# Patient Record
Sex: Female | Born: 1945 | Race: White | Hispanic: No | Marital: Married | Smoking: Never smoker
Health system: Southern US, Community
[De-identification: ages and names within clinical notes are randomized; demographics above are authoritative.]

## PROBLEM LIST (undated history)

## (undated) DIAGNOSIS — M1990 Unspecified osteoarthritis, unspecified site: Secondary | ICD-10-CM

## (undated) DIAGNOSIS — Z789 Other specified health status: Secondary | ICD-10-CM

## (undated) DIAGNOSIS — Z8489 Family history of other specified conditions: Secondary | ICD-10-CM

## (undated) HISTORY — PX: CATARACT EXTRACTION: SUR2

## (undated) HISTORY — PX: WISDOM TOOTH EXTRACTION: SHX21

---

## 1985-02-01 HISTORY — PX: ABDOMINAL HYSTERECTOMY: SHX81

## 1998-04-05 ENCOUNTER — Other Ambulatory Visit: Admission: RE | Admit: 1998-04-05 | Discharge: 1998-04-05 | Payer: Self-pay | Admitting: Obstetrics and Gynecology

## 1999-11-04 HISTORY — PX: CARPAL TUNNEL RELEASE: SHX101

## 2000-09-10 ENCOUNTER — Ambulatory Visit (HOSPITAL_BASED_OUTPATIENT_CLINIC_OR_DEPARTMENT_OTHER): Admission: RE | Admit: 2000-09-10 | Discharge: 2000-09-10 | Payer: Self-pay | Admitting: Orthopedic Surgery

## 2001-06-09 ENCOUNTER — Other Ambulatory Visit: Admission: RE | Admit: 2001-06-09 | Discharge: 2001-06-09 | Payer: Self-pay | Admitting: Obstetrics and Gynecology

## 2001-07-13 ENCOUNTER — Encounter: Admission: RE | Admit: 2001-07-13 | Discharge: 2001-07-13 | Payer: Self-pay | Admitting: Otolaryngology

## 2001-07-13 ENCOUNTER — Encounter: Payer: Self-pay | Admitting: Otolaryngology

## 2001-10-22 ENCOUNTER — Encounter: Payer: Self-pay | Admitting: Obstetrics and Gynecology

## 2001-10-22 ENCOUNTER — Encounter: Admission: RE | Admit: 2001-10-22 | Discharge: 2001-10-22 | Payer: Self-pay | Admitting: Obstetrics and Gynecology

## 2002-09-14 ENCOUNTER — Encounter: Admission: RE | Admit: 2002-09-14 | Discharge: 2002-09-14 | Payer: Self-pay | Admitting: Family Medicine

## 2002-09-14 ENCOUNTER — Encounter: Payer: Self-pay | Admitting: Family Medicine

## 2002-11-03 HISTORY — PX: CHOLECYSTECTOMY: SHX55

## 2003-05-11 ENCOUNTER — Encounter: Payer: Self-pay | Admitting: Emergency Medicine

## 2003-05-11 ENCOUNTER — Observation Stay (HOSPITAL_COMMUNITY): Admission: EM | Admit: 2003-05-11 | Discharge: 2003-05-12 | Payer: Self-pay | Admitting: Emergency Medicine

## 2003-05-11 ENCOUNTER — Encounter: Payer: Self-pay | Admitting: General Surgery

## 2003-05-11 ENCOUNTER — Encounter (INDEPENDENT_AMBULATORY_CARE_PROVIDER_SITE_OTHER): Payer: Self-pay | Admitting: Specialist

## 2004-01-25 ENCOUNTER — Encounter: Admission: RE | Admit: 2004-01-25 | Discharge: 2004-01-25 | Payer: Self-pay | Admitting: Obstetrics and Gynecology

## 2008-06-06 ENCOUNTER — Encounter: Admission: RE | Admit: 2008-06-06 | Discharge: 2008-06-06 | Payer: Self-pay | Admitting: Obstetrics and Gynecology

## 2010-01-28 ENCOUNTER — Encounter: Admission: RE | Admit: 2010-01-28 | Discharge: 2010-01-28 | Payer: Self-pay | Admitting: Obstetrics & Gynecology

## 2010-11-24 ENCOUNTER — Encounter: Payer: Self-pay | Admitting: Obstetrics and Gynecology

## 2011-03-21 NOTE — Op Note (Signed)
Michelle Price, Michelle Price                  ACCOUNT NO.:  [id number]   MEDICAL RECORD NO.:  [id number]                   PATIENT TYPE:  INP   LOCATION:  0105                                 FACILITY:  [facility]   PHYSICIAN:  Adolph Pollack, M.D.            DATE OF BIRTH:  [birth year]   DATE OF PROCEDURE:  05/11/2003  DATE OF DISCHARGE:                                 OPERATIVE REPORT   PREOPERATIVE DIAGNOSIS:  Acute cholecystitis.   POSTOPERATIVE DIAGNOSIS:  Acute cholecystitis.   PROCEDURE:  Laparoscopic cholecystectomy with intraoperative cholangiogram.   SURGEON:  Adolph Pollack, M.D.   ASSISTANT:  Currie Paris, M.D.   ANESTHESIA:  General.   INDICATIONS:  Michelle Price is a 65 year old female who after lunch  yesterday began having some epigastric pain radiating through the back and  nausea and vomiting.  The pain progressed and persisted all night long,  leading her to come to the emergency department, where evaluation  demonstrated gallstones, an elevation of white count, and a right upper  quadrant and epigastric tenderness and guarding.  She is now brought to the  operating room for a cholecystectomy.  The procedure and the risks were  explained to her preoperatively.   TECHNIQUE:  She was brought to the operating room and placed supine on the  operating room table, and a general anesthetic was administered.  He abdomen  was sterilely prepped and draped.  She had a previous lower midline  incision.  A small supraumbilical incision was made after injecting 0.5%  plain Marcaine, incising the skin and subcutaneous tissue until the midline  fascia was identified and a small incision made in the midline fascia.  The  peritoneal cavity was then entered bluntly and under direct vision.  A  pursestring suture of 0 Vicryl was placed around the fascial edges.  A  Hasson trocar was introduced into the peritoneal cavity and pneumoperitoneum  was created by  insufflation of CO2 gas.   Next a laparoscope was introduced and we could see acute inflammation of the  fundus and body of the gallbladder.  She was then placed in the reverse  Trendelenburg position with the right side tilted up.  An 11 mm trocar was  placed through an epigastric incision and two 5 mm trocars placed through  right midabdominal incisions.  The gallbladder was tense and bile was  aspirated from it allowing the fundus to be grasped and retracted toward the  right shoulder.  There were multiple omental adhesions to the body and  infundibulum, which were taken down bluntly.  The infundibulum was then  grasped and mobilized using blunt dissection with the cautery.  I then  isolated the cystic duct and created a window around it.  I also created a  window around the cystic artery.  I placed a clip just above the cystic duct-  gallbladder junction and a small incision was made in the cystic duct.  A  cholangiocatheter was passed through the anterior abdominal wall into the  cystic duct, and the cholangiogram was performed.   Under real-time fluoroscopy dilute contrast was injected into the cystic  duct, which was of moderate length.  The common hepatic, right and left  hepatic, and common bile ducts all filled promptly, and the common bile duct  promptly drained contrast into the duodenum without obvious evidence of  obstruction.  The final report is pending the radiologist's interpretation.   The cholangiocatheter was removed, the cystic duct was clipped three times  on the staying-in side and divided.  The cystic artery was then clipped and  divided.  The gallbladder was then dissected free from the liver bed.  There  was a small vein going directly into the gallbladder, which was clipped and  divided.  Once the gallbladder was dissected free from the liver bed, it was  placed in an Endopouch bag.  I then irrigated out the gallbladder fossa and  bleeding points were  controlled with cautery.  There was some raw surface of  the liver, and I applied Surgicel to this.  I then irrigated the area once  again and noticed no further bleeding.  I noticed no bile leakage.   I evacuated as much of the fluid as possible.  I the copiously irrigated the  perihepatic area once again and evacuated the fluid so it was relatively  clear.   I then grasped the Endopouch bag and was pulling it out through the  supraumbilical port when the bag broke and some stones spilled just directly  beneath the umbilicus.  I removed the gallbladder and then one by one was  able to retrieve all of these stones that were spilled as well as the  fragments.  I then copiously irrigated out this area and evacuated out the  fluid.   Next, under direct laparoscopic vision I closed the supraumbilical fascial  defect by tightening up and tying down the pursestring suture.  The  remaining trocars were removed, and the pneumoperitoneum was released.  The  skin incisions were closed with 4-0 Monocryl subcuticular stitches.  Steri-  Strips and sterile dressings were applied.   She tolerated the procedure well without any apparent complications and was  taken to the recovery room in satisfactory condition.                                               Adolph Pollack, M.D.    Kari Baars  D:  05/11/2003  T:  05/11/2003  Job:  [id number]   cc:   Veverly Fells. Altheimer, M.D.  [address]  [place]  [place] [address]  Fax: [phone]

## 2011-03-21 NOTE — H&P (Signed)
Michelle Price, Michelle Price                  ACCOUNT NO.:  [id number]   MEDICAL RECORD NO.:  [id number]                   PATIENT TYPE:  EMS   LOCATION:  ED                                   FACILITY:  [facility]   PHYSICIAN:  Adolph Pollack, M.D.            DATE OF BIRTH:  [birth year]   DATE OF ADMISSION:  05/11/2003  DATE OF DISCHARGE:                                HISTORY & PHYSICAL   CHIEF COMPLAINT:  Epigastric pain, nausea, vomiting.   HISTORY OF PRESENT ILLNESS:  Michelle Price is a 65 year old female who  had a spicy meal for lunch, and at about 3 o'clock began having the sudden  onset of cramping pressure-type epigastric pain going through to the back.  The pain persisted last night and was associated with nausea and vomiting  and chills.  It did not let up, and she came to the emergency department  where she was evaluated.  She was found to have multiple gallstones on  ultrasound, but no gallbladder wall thickening or pericholecystic fluid.  She did have a leukocytosis.  She had been given some pain medication, but  continues to have the discomfort, and I was asked to see her.  She denies  any jaundice or previous attacks like this.   PAST MEDICAL HISTORY:  1. Thyroid nodule.  2. Acute bronchitis.   PREVIOUS OPERATIONS:  Hysterectomy.   ALLERGIES:  ERYTHROMYCIN.   MEDICATIONS:  None.   SOCIAL HISTORY:  Denies tobacco use.  Has a glass of wine occasionally.  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   FAMILY HISTORY:  Father died from emphysema.  He also had diabetes.  She has  multiple members of her family who have gallstones and gallbladder disease.   REVIEW OF SYSTEMS:  CARDIOVASCULAR:  She denies any heart disease or  hypertension.  RESPIRATORY:  She denies pneumonia, tuberculosis.  GI:  She  denies peptic ulcer disease, hepatitis, diverticulitis, hiatal hernia.  GU:  Denies any kidney stones.  ENDOCRINE:  Denies diabetes.  NEUROLOGIC:  Denies  strokes or  seizures.  HEMATOLOGIC:  Denies any bleeding disorders,  transfusions, deep vein thrombosis.   PHYSICAL EXAMINATION:  GENERAL:  A slightly uncomfortable-appearing female,  very pleasant and cooperative.  VITAL SIGNS:  Temperature is 97.8, blood pressure 151/91, pulse 72.  HEENT:  Eyes - Extraocular muscles intact.  No scleral icterus.  NECK:  Supple without palpable masses, but there is a left thyroid nodule  noted when she swallows.  There is also a right clavicular scar noted.  RESPIRATORY:  Breath sounds equal and clear.  Respirations unlabored.  CARDIOVASCULAR:  Demonstrates a regular rate and rhythm.  No murmur heard.  She has strong peripheral pulses to palpation.  ABDOMEN:  Soft.  Right upper quadrant epigastric tenderness and guarding.  There is a positive Murphy's sign present.  Hypoactive bowel sounds are  noted.  There is a lower midline scar that is also noted.  BACK:  No deformities.  EXTREMITIES:  Full range of motion, and ambulates fairly well.   LABORATORY DATA:  White cell count is 16,700 with a hemoglobin of 13.8.  Liver function tests are within normal limits.  Essentially glucose is 135.  The rest of her CMET is within normal limits.  Lipase is 19.  UA is  essentially negative.   Abdominal ultrasound was reviewed and showed multiple gallstones.   IMPRESSION:  Evolving acute cholecystitis.  Continues to have pain despite  two rounds of 4 mg of morphine and Phenergan.  Does have some local  peritoneal signs in the right upper quadrant.   PLAN:  I discussed with her two options of antibiotic therapy and delay  cholecystectomy.  We also discussed laparoscopic cholecystectomy which I  said was the preferred treatment overall.  We went over the procedure and  the risks including, but not limited to, bleeding, infection, common bile  duct injury, hepatic injury and bile leak, small intestinal injury, the risk  of general anesthesia, as well as post cholecystectomy  diarrhea or  indigestion.  She seemed to understand these and would like to proceed.                                               Adolph Pollack, M.D.    Kari Baars  D:  05/11/2003  T:  05/11/2003  Job:  [id number]   cc:   Veverly Fells. Altheimer, M.D.  [address], [address]  [place]  [place] [address]  Fax: [phone]

## 2011-03-21 NOTE — Op Note (Signed)
[place]  Patient:    Michelle Price, Michelle Price                 MRN: [id number] Proc. Date: 09/10/00 Adm. Date:  [id number] Attending:  Ronne Binning                           Operative Report  PREOPERATIVE DIAGNOSIS:  Carpal tunnel syndrome, right hand.  POSTOPERATIVE DIAGNOSIS:  Carpal tunnel syndrome, right hand.  PROCEDURE:  Decompression, right median nerve.  SURGEON:  Nicki Reaper, M.D.  ASSISTANT:  R.N.  ANESTHESIA:  Forearm-based IV regional.  ANESTHESIOLOGIST:  Cliffton Asters. Ivin Booty, M.D.  HISTORY:  The patient is a 65 year old female with a history of carpal tunnel syndrome.  EMG nerve conduction was positive, which has not responded to conservative treatment.  DESCRIPTION OF PROCEDURE:  The patient was brought to the operating room, where a forearm-based IV regional anesthetic was carried out without difficulty.  She was prepped and draped using Betadine scrub and solution with the right arm free.  A longitudinal incision was made in the palm and carried down through subcutaneous tissue.  Bleeders were electrocauterized.  Palmar fascia was split, superficial palmar arch identified, and flexor tendon in the ring and little finger identified to the ulnar side of the median nerve.  The carpal retinaculum was incised with sharp dissection.  A right angle and Sewell retractor were placed between skin and forearm fascia.  The fascia was then released for approximately 3 cm proximal to the wrist crease under direct vision.  The canal was explored, no further lesions were identified. Tenosynovial tissue was moderately thickened.  The wound was irrigated.  The skin was closed with interrupted 5-0 nylon suture.  A sterile compressive dressing and splint were applied.  The patient tolerated the procedure well and was taken to the recovery room for observation in satisfactory condition. She is discharged home, to return to the [facility]  [place] in one week, on Vicodin and Keflex. DD:  09/10/00 TD:  09/11/00 Job: [id number]

## 2011-11-04 HISTORY — PX: BREAST CYST ASPIRATION: SHX578

## 2012-06-21 ENCOUNTER — Other Ambulatory Visit: Payer: Self-pay | Admitting: Obstetrics & Gynecology

## 2012-06-21 DIAGNOSIS — Z1231 Encounter for screening mammogram for malignant neoplasm of breast: Secondary | ICD-10-CM

## 2012-06-22 ENCOUNTER — Ambulatory Visit
Admission: RE | Admit: 2012-06-22 | Discharge: 2012-06-22 | Disposition: A | Discharge status: Home / Self Care | Payer: PRIVATE HEALTH INSURANCE | Source: Ambulatory Visit | Attending: Obstetrics & Gynecology | Admitting: Obstetrics & Gynecology

## 2012-06-22 DIAGNOSIS — Z1231 Encounter for screening mammogram for malignant neoplasm of breast: Secondary | ICD-10-CM

## 2012-06-23 ENCOUNTER — Other Ambulatory Visit: Payer: Self-pay | Admitting: Obstetrics & Gynecology

## 2012-06-23 DIAGNOSIS — R928 Other abnormal and inconclusive findings on diagnostic imaging of breast: Secondary | ICD-10-CM

## 2012-06-30 ENCOUNTER — Other Ambulatory Visit (HOSPITAL_COMMUNITY)
Admission: RE | Admit: 2012-06-30 | Discharge: 2012-06-30 | Disposition: A | Discharge status: Home / Self Care | Payer: PRIVATE HEALTH INSURANCE | Source: Ambulatory Visit | Attending: Radiology | Admitting: Radiology

## 2012-06-30 ENCOUNTER — Ambulatory Visit
Admission: RE | Admit: 2012-06-30 | Discharge: 2012-06-30 | Disposition: A | Discharge status: Home / Self Care | Payer: PRIVATE HEALTH INSURANCE | Source: Ambulatory Visit | Attending: Obstetrics & Gynecology | Admitting: Obstetrics & Gynecology

## 2012-06-30 ENCOUNTER — Other Ambulatory Visit: Payer: Self-pay | Admitting: Obstetrics & Gynecology

## 2012-06-30 DIAGNOSIS — R928 Other abnormal and inconclusive findings on diagnostic imaging of breast: Secondary | ICD-10-CM

## 2012-06-30 DIAGNOSIS — N6009 Solitary cyst of unspecified breast: Secondary | ICD-10-CM | POA: Insufficient documentation

## 2013-05-09 ENCOUNTER — Other Ambulatory Visit: Payer: Self-pay | Admitting: Nurse Practitioner

## 2013-05-09 NOTE — Telephone Encounter (Signed)
Patient was given rx #30/[id number] on 11/10/12. AEX scheduled 11/15/13. Please advise if ok to refill until then. Chart on your desk.

## 2013-05-09 NOTE — Telephone Encounter (Signed)
OK to refill for 6 more months

## 2013-05-10 NOTE — Telephone Encounter (Signed)
RX signed and faxed.

## 2013-05-10 NOTE — Telephone Encounter (Signed)
RX printed for signature. 

## 2013-07-27 ENCOUNTER — Telehealth: Payer: Self-pay | Admitting: Nurse Practitioner

## 2013-07-27 NOTE — Telephone Encounter (Signed)
Patient is switching insurances and vivele dot medication she is taking is going to cost her a lot more than it was and she was wondering if there was another medication that was comparable to it that could replace the vivele dot. (new insurance is medicare complete)

## 2013-07-28 NOTE — Telephone Encounter (Signed)
Patty do you have any advise regarding change from vivele dot to anything that may cost less?

## 2013-07-29 NOTE — Telephone Encounter (Signed)
After review of her chart - the dose of Vivelle is 0.025 - there is no Minivelle dose available in this strength. The other choice is oral estrogen and the lowest dose in 0.5 - so would have to cut in half.  Probably the best choice is to get a refill on Vivelle patches for a few months and wean off ERT.  She has reached her 10 year maximum benefit for use of ERT.  But this may be a personal preference to stay on.

## 2013-07-29 NOTE — Telephone Encounter (Signed)
Spoke with patient. She is agreeable to wean off the patches. She states that her mother was on hormones until age 66 and she feels she may need to continue. She states she will taper and then call if anything changes or will discuss at her aex in January.

## 2013-07-29 NOTE — Telephone Encounter (Signed)
Spoke with Patty:  To wean: Use one patch q 5 days for a two weeks, then use one patch q 7 days for two weeks then one patch q 14 days until done.   Message left to return call to nurse at [phone]

## 2013-08-30 ENCOUNTER — Telehealth: Payer: Self-pay

## 2013-08-30 ENCOUNTER — Other Ambulatory Visit: Payer: Self-pay | Admitting: Orthopedic Surgery

## 2013-08-30 NOTE — Telephone Encounter (Signed)
Recall has been entered for 10-03-13.

## 2013-08-30 NOTE — Telephone Encounter (Signed)
I agree with changing recall.  She is clearly aware and documentation should be adequate.  Thanks.

## 2013-08-30 NOTE — Telephone Encounter (Signed)
Message copied by Elisha Headland on Tue Aug 30, 2013 11:07 AM ------      Message from: Alisa Graff      Created: Tue Aug 23, 2013 11:28 AM      Regarding: MMG recall       Please contact this patient to see if she has had MMG this year.  Was due in Feb and no results in computer. ------

## 2013-08-30 NOTE — Telephone Encounter (Signed)
Since patient clearly aware, should we send letter and remove?

## 2013-08-30 NOTE — Telephone Encounter (Signed)
lmtcb

## 2013-08-30 NOTE — Telephone Encounter (Signed)
Spoke with patient, states she has not had the MMG yet, but has not forgotten it. She has had a busy summer and is having hand surgery next week. I offered to schedule appointment for her, but she declined. She said she would have it in November, not sure if she will be able to drive and will have to coordinate with a driver. I put back in recall for 10/03/13. Please advise if this is not correct or I need to do anything additional.

## 2013-09-02 ENCOUNTER — Encounter (HOSPITAL_BASED_OUTPATIENT_CLINIC_OR_DEPARTMENT_OTHER): Payer: Self-pay | Admitting: *Deleted

## 2013-09-02 NOTE — Progress Notes (Signed)
No labs needed

## 2013-09-07 ENCOUNTER — Encounter (HOSPITAL_BASED_OUTPATIENT_CLINIC_OR_DEPARTMENT_OTHER): Payer: Medicare Other | Admitting: Anesthesiology

## 2013-09-07 ENCOUNTER — Encounter (HOSPITAL_BASED_OUTPATIENT_CLINIC_OR_DEPARTMENT_OTHER): Payer: Self-pay | Admitting: Orthopedic Surgery

## 2013-09-07 ENCOUNTER — Ambulatory Visit (HOSPITAL_BASED_OUTPATIENT_CLINIC_OR_DEPARTMENT_OTHER)
Admission: RE | Admit: 2013-09-07 | Discharge: 2013-09-07 | Disposition: A | Discharge status: Home / Self Care | Payer: Medicare Other | Source: Ambulatory Visit | Attending: Orthopedic Surgery | Admitting: Orthopedic Surgery

## 2013-09-07 ENCOUNTER — Ambulatory Visit (HOSPITAL_BASED_OUTPATIENT_CLINIC_OR_DEPARTMENT_OTHER): Payer: Medicare Other | Admitting: Anesthesiology

## 2013-09-07 ENCOUNTER — Encounter (HOSPITAL_BASED_OUTPATIENT_CLINIC_OR_DEPARTMENT_OTHER)
Admission: RE | Disposition: A | Discharge status: Home / Self Care | Payer: Self-pay | Source: Ambulatory Visit | Attending: Orthopedic Surgery

## 2013-09-07 DIAGNOSIS — Z9089 Acquired absence of other organs: Secondary | ICD-10-CM | POA: Insufficient documentation

## 2013-09-07 DIAGNOSIS — Z885 Allergy status to narcotic agent status: Secondary | ICD-10-CM | POA: Insufficient documentation

## 2013-09-07 DIAGNOSIS — M19049 Primary osteoarthritis, unspecified hand: Secondary | ICD-10-CM | POA: Insufficient documentation

## 2013-09-07 DIAGNOSIS — G5600 Carpal tunnel syndrome, unspecified upper limb: Secondary | ICD-10-CM | POA: Insufficient documentation

## 2013-09-07 DIAGNOSIS — Z90710 Acquired absence of both cervix and uterus: Secondary | ICD-10-CM | POA: Insufficient documentation

## 2013-09-07 HISTORY — DX: Unspecified osteoarthritis, unspecified site: M19.90

## 2013-09-07 HISTORY — PX: CARPAL TUNNEL RELEASE: SHX101

## 2013-09-07 HISTORY — PX: STERIOD INJECTION: SHX5046

## 2013-09-07 HISTORY — DX: Family history of other specified conditions: Z84.89

## 2013-09-07 HISTORY — DX: Other specified health status: Z78.9

## 2013-09-07 LAB — POCT HEMOGLOBIN-HEMACUE: Hemoglobin: 12 g/dL (ref 12.0–15.0)

## 2013-09-07 SURGERY — CARPAL TUNNEL RELEASE
Anesthesia: Monitor Anesthesia Care | Site: Wrist | Laterality: Left | Wound class: Clean

## 2013-09-07 MED ORDER — BUPIVACAINE HCL (PF) 0.25 % IJ SOLN
INTRAMUSCULAR | Status: DC | PRN
  Administered 2013-09-07: 7 mL
  Administered 2013-09-07: .5 mL

## 2013-09-07 MED ORDER — CHLORHEXIDINE GLUCONATE 4 % EX LIQD: 60.0000 mL | Freq: Once | CUTANEOUS | Status: DC

## 2013-09-07 MED ORDER — HYDROMORPHONE HCL PF 1 MG/ML IJ SOLN
INTRAMUSCULAR | Status: AC
  Filled 2013-09-07: qty 1

## 2013-09-07 MED ORDER — BUPIVACAINE HCL (PF) 0.25 % IJ SOLN
INTRAMUSCULAR | Status: AC
  Filled 2013-09-07: qty 30

## 2013-09-07 MED ORDER — MIDAZOLAM HCL 2 MG/2ML IJ SOLN
INTRAMUSCULAR | Status: AC
  Filled 2013-09-07: qty 2

## 2013-09-07 MED ORDER — HYDROMORPHONE HCL PF 1 MG/ML IJ SOLN
0.2500 mg | INTRAMUSCULAR | Status: DC | PRN
  Administered 2013-09-07 (×2): 0.5 mg via INTRAVENOUS

## 2013-09-07 MED ORDER — PROPOFOL 10 MG/ML IV BOLUS
INTRAVENOUS | Status: DC | PRN
  Administered 2013-09-07: 40 mg via INTRAVENOUS

## 2013-09-07 MED ORDER — FENTANYL CITRATE 0.05 MG/ML IJ SOLN
INTRAMUSCULAR | Status: AC
  Filled 2013-09-07: qty 2

## 2013-09-07 MED ORDER — BETAMETHASONE SOD PHOS & ACET 6 (3-3) MG/ML IJ SUSP
INTRAMUSCULAR | Status: AC
  Filled 2013-09-07: qty 1

## 2013-09-07 MED ORDER — MIDAZOLAM HCL 5 MG/5ML IJ SOLN
INTRAMUSCULAR | Status: DC | PRN
  Administered 2013-09-07: 2 mg via INTRAVENOUS

## 2013-09-07 MED ORDER — CEFAZOLIN SODIUM-DEXTROSE 2-3 GM-% IV SOLR
2.0000 g | INTRAVENOUS | Status: AC
  Administered 2013-09-07: 2 g via INTRAVENOUS

## 2013-09-07 MED ORDER — ONDANSETRON HCL 4 MG/2ML IJ SOLN: 4.0000 mg | Freq: Once | INTRAMUSCULAR | Status: DC | PRN

## 2013-09-07 MED ORDER — LACTATED RINGERS IV SOLN
INTRAVENOUS | Status: DC
  Administered 2013-09-07 (×2): via INTRAVENOUS

## 2013-09-07 MED ORDER — SODIUM CHLORIDE 0.9 % IJ SOLN
INTRAMUSCULAR | Status: AC
  Filled 2013-09-07: qty 10

## 2013-09-07 MED ORDER — LIDOCAINE HCL (PF) 0.5 % IJ SOLN
INTRAMUSCULAR | Status: DC | PRN
  Administered 2013-09-07: 30 mL via INTRAVENOUS

## 2013-09-07 MED ORDER — CEFAZOLIN SODIUM-DEXTROSE 2-3 GM-% IV SOLR
INTRAVENOUS | Status: AC
  Filled 2013-09-07: qty 50

## 2013-09-07 MED ORDER — BETAMETHASONE SOD PHOS & ACET 6 (3-3) MG/ML IJ SUSP
INTRAMUSCULAR | Status: DC | PRN
  Administered 2013-09-07: 3 mg via INTRA_ARTICULAR

## 2013-09-07 MED ORDER — OXYCODONE HCL 5 MG/5ML PO SOLN
5.0000 mg | Freq: Once | ORAL | Status: DC | PRN
Start: 2013-09-07 — End: 2013-09-07

## 2013-09-07 MED ORDER — ONDANSETRON HCL 4 MG/2ML IJ SOLN
INTRAMUSCULAR | Status: DC | PRN
  Administered 2013-09-07: 4 mg via INTRAVENOUS

## 2013-09-07 MED ORDER — PROPOFOL 10 MG/ML IV EMUL
INTRAVENOUS | Status: AC
  Filled 2013-09-07: qty 50

## 2013-09-07 MED ORDER — METHYLPREDNISOLONE ACETATE 40 MG/ML IJ SUSP
INTRAMUSCULAR | Status: AC
  Filled 2013-09-07: qty 1

## 2013-09-07 MED ORDER — PROMETHAZINE HCL 25 MG/ML IJ SOLN
INTRAMUSCULAR | Status: AC
  Filled 2013-09-07: qty 1

## 2013-09-07 MED ORDER — OXYCODONE HCL 5 MG PO TABS
5.0000 mg | ORAL_TABLET | Freq: Once | ORAL | Status: DC | PRN
Start: 2013-09-07 — End: 2013-09-07

## 2013-09-07 MED ORDER — PROPOFOL INFUSION 10 MG/ML OPTIME
INTRAVENOUS | Status: DC | PRN
  Administered 2013-09-07: 100 ug/kg/min via INTRAVENOUS

## 2013-09-07 MED ORDER — PROMETHAZINE HCL 25 MG/ML IJ SOLN
6.2500 mg | INTRAMUSCULAR | Status: DC | PRN
  Administered 2013-09-07: 6.25 mg via INTRAVENOUS

## 2013-09-07 MED ORDER — FENTANYL CITRATE 0.05 MG/ML IJ SOLN
INTRAMUSCULAR | Status: DC | PRN
  Administered 2013-09-07: 100 ug via INTRAVENOUS

## 2013-09-07 SURGICAL SUPPLY — 49 items
BANDAGE ADHESIVE 1X3 (GAUZE/BANDAGES/DRESSINGS) IMPLANT
BANDAGE GAUZE ELAST BULKY 4 IN (GAUZE/BANDAGES/DRESSINGS) ×3 IMPLANT
BLADE SURG 15 STRL LF DISP TIS (BLADE) ×2 IMPLANT
BLADE SURG 15 STRL SS (BLADE) ×1
BNDG COHESIVE 3X5 TAN STRL LF (GAUZE/BANDAGES/DRESSINGS) ×3 IMPLANT
BNDG ESMARK 4X9 LF (GAUZE/BANDAGES/DRESSINGS) IMPLANT
CHLORAPREP W/TINT 26ML (MISCELLANEOUS) ×3 IMPLANT
CORDS BIPOLAR (ELECTRODE) ×3 IMPLANT
COVER MAYO STAND STRL (DRAPES) ×3 IMPLANT
COVER TABLE BACK 60X90 (DRAPES) ×3 IMPLANT
CUFF TOURNIQUET SINGLE 18IN (TOURNIQUET CUFF) ×3 IMPLANT
DECANTER SPIKE VIAL GLASS SM (MISCELLANEOUS) IMPLANT
DRAPE EXTREMITY T 121X128X90 (DRAPE) ×3 IMPLANT
DRAPE SURG 17X23 STRL (DRAPES) ×3 IMPLANT
DRSG KUZMA FLUFF (GAUZE/BANDAGES/DRESSINGS) IMPLANT
DRSG PAD ABDOMINAL 8X10 ST (GAUZE/BANDAGES/DRESSINGS) ×3 IMPLANT
GAUZE XEROFORM 1X8 LF (GAUZE/BANDAGES/DRESSINGS) ×3 IMPLANT
GLOVE BIO SURGEON STRL SZ 6.5 (GLOVE) IMPLANT
GLOVE BIOGEL M 7.0 STRL (GLOVE) ×3 IMPLANT
GLOVE BIOGEL PI IND STRL 7.0 (GLOVE) ×2 IMPLANT
GLOVE BIOGEL PI IND STRL 7.5 (GLOVE) ×2 IMPLANT
GLOVE BIOGEL PI IND STRL 8.5 (GLOVE) ×2 IMPLANT
GLOVE BIOGEL PI INDICATOR 7.0 (GLOVE) ×1
GLOVE BIOGEL PI INDICATOR 7.5 (GLOVE) ×1
GLOVE BIOGEL PI INDICATOR 8.5 (GLOVE) ×1
GLOVE ECLIPSE 6.5 STRL STRAW (GLOVE) ×3 IMPLANT
GLOVE SURG ORTHO 8.0 STRL STRW (GLOVE) ×3 IMPLANT
GOWN BRE IMP PREV XXLGXLNG (GOWN DISPOSABLE) ×3 IMPLANT
GOWN PREVENTION PLUS XLARGE (GOWN DISPOSABLE) ×6 IMPLANT
NDL SAFETY ECLIPSE 18X1.5 (NEEDLE) IMPLANT
NEEDLE 27GAX1X1/2 (NEEDLE) ×6 IMPLANT
NEEDLE HYPO 18GX1.5 SHARP (NEEDLE)
NS IRRIG 1000ML POUR BTL (IV SOLUTION) ×3 IMPLANT
PACK BASIN DAY SURGERY FS (CUSTOM PROCEDURE TRAY) ×3 IMPLANT
PAD ALCOHOL SWAB (MISCELLANEOUS) ×3 IMPLANT
PAD CAST 3X4 CTTN HI CHSV (CAST SUPPLIES) ×2 IMPLANT
PADDING CAST ABS 4INX4YD NS (CAST SUPPLIES) ×1
PADDING CAST ABS COTTON 4X4 ST (CAST SUPPLIES) ×2 IMPLANT
PADDING CAST COTTON 3X4 STRL (CAST SUPPLIES) ×1
SPONGE GAUZE 4X4 12PLY (GAUZE/BANDAGES/DRESSINGS) ×3 IMPLANT
STOCKINETTE 4X48 STRL (DRAPES) ×3 IMPLANT
SUT VICRYL 4-0 PS2 18IN ABS (SUTURE) IMPLANT
SUT VICRYL RAPIDE 4/0 PS 2 (SUTURE) ×3 IMPLANT
SWABSTICK POVIDONE IODINE SNGL (MISCELLANEOUS) IMPLANT
SYR 3ML LL SCALE MARK (SYRINGE) ×3 IMPLANT
SYR BULB 3OZ (MISCELLANEOUS) ×3 IMPLANT
SYR CONTROL 10ML LL (SYRINGE) ×3 IMPLANT
TOWEL OR 17X24 6PK STRL BLUE (TOWEL DISPOSABLE) ×3 IMPLANT
UNDERPAD 30X30 INCONTINENT (UNDERPADS AND DIAPERS) ×3 IMPLANT

## 2013-09-07 NOTE — Anesthesia Preprocedure Evaluation (Addendum)
Anesthesia Evaluation  Patient identified by MRN, date of birth, ID band Patient awake    Reviewed: Allergy & Precautions, H&P , NPO status , Patient's Chart, lab work & pertinent test results  Airway Mallampati: I TM Distance: >3 FB Neck ROM: Full    Dental  (+) Teeth Intact and Dental Advisory Given   Pulmonary  breath sounds clear to auscultation        Cardiovascular Rhythm:Regular Rate:Normal     Neuro/Psych    GI/Hepatic   Endo/Other    Renal/GU      Musculoskeletal   Abdominal   Peds  Hematology   Anesthesia Other Findings   Reproductive/Obstetrics                           Anesthesia Physical Anesthesia Plan  ASA: I  Anesthesia Plan: Bier Block and MAC   Post-op Pain Management:    Induction: Intravenous  Airway Management Planned: Simple Face Mask  Additional Equipment:   Intra-op Plan:   Post-operative Plan:   Informed Consent: I have reviewed the patients History and Physical, chart, labs and discussed the procedure including the risks, benefits and alternatives for the proposed anesthesia with the patient or authorized representative who has indicated his/her understanding and acceptance.   Dental advisory given  Plan Discussed with: CRNA, Anesthesiologist and Surgeon  Anesthesia Plan Comments:        Anesthesia Quick Evaluation

## 2013-09-07 NOTE — Op Note (Signed)
Dictation Number [id number]

## 2013-09-07 NOTE — Transfer of Care (Signed)
Immediate Anesthesia Transfer of Care Note  Patient: Michelle Price  Procedure(s) Performed: Procedure(s): LEFT CARPAL TUNNEL RELEASE (Left)  INJECTION LEFT CARPALMETACARPAL THUMB (Left)  Patient Location: PACU  Anesthesia Type:MAC and Bier block  Level of Consciousness: awake, alert  and oriented  Airway & Oxygen Therapy: Patient Spontanous Breathing and Patient connected to face mask oxygen  Post-op Assessment: Report given to PACU RN and Post -op Vital signs reviewed and stable  Post vital signs: Reviewed and stable  Complications: No apparent anesthesia complications

## 2013-09-07 NOTE — Brief Op Note (Signed)
09/07/2013  12:15 PM  PATIENT:  Michelle Price  67 y.o. female  PRE-OPERATIVE DIAGNOSIS:  LEFT CARPAL TUNNEL SYNDROME,ARTHRITIS CARPALMETACARPAL THUMB  POST-OPERATIVE DIAGNOSIS:  * No post-op diagnosis entered *  PROCEDURE:  Procedure(s): LEFT CARPAL TUNNEL RELEASE (Left)  INJECTION LEFT CARPALMETACARPAL THUMB (Left)  SURGEON:  Surgeon(s) and Role:    * Nicki Reaper, MD - Primary  PHYSICIAN ASSISTANT:   ASSISTANTS: none   ANESTHESIA:   local and regional  EBL:  Total I/O In: 700 [I.V.:700] Out: -   BLOOD ADMINISTERED:none  DRAINS: none   LOCAL MEDICATIONS USED:  MARCAINE     SPECIMEN:  No Specimen  DISPOSITION OF SPECIMEN:  N/A  COUNTS:  YES  TOURNIQUET:  * Missing tourniquet times found for documented tourniquets in log:  [id number] *  DICTATION: .Other Dictation: Dictation Number [id number]  PLAN OF CARE: Discharge to home after PACU  PATIENT DISPOSITION:  PACU - hemodynamically stable.

## 2013-09-07 NOTE — Anesthesia Postprocedure Evaluation (Signed)
  Anesthesia Post-op Note  Patient: Michelle Price  Procedure(s) Performed: Procedure(s): LEFT CARPAL TUNNEL RELEASE (Left)  INJECTION LEFT CARPALMETACARPAL THUMB (Left)  Patient Location: PACU  Anesthesia Type:General  Level of Consciousness: awake, alert  and oriented  Airway and Oxygen Therapy: Patient Spontanous Breathing and Patient connected to face mask oxygen  Post-op Pain: none  Post-op Assessment: Post-op Vital signs reviewed  Post-op Vital Signs: Reviewed  Complications: No apparent anesthesia complications

## 2013-09-07 NOTE — H&P (Signed)
Michelle Price returns today. She has not been seen in 13-years. She comes in complaining of her left hand basilar area of her thumb. This has been going on for the past 2 years. It has gotten worse over the past 3-4 months. She reports no history of injury. She is s/p right carpal tunnel release done in 2001 by me. She had positive nerve conductions at that time bilaterally. She is not complaining of a great deal of numbness and tingling. It does not awaken her at night. She complains of intermittent severe, throbbing, aching and burning type pain with a feeling of numbness and weakness. She states it is gradually getting worse. Activity and exercise make it worse, rest makes it better. She has been taking ibuprofen for this. 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 The splinting has given her some relief of symptoms. We have discussed the possibility of an injection to the carpal canal vs surgical intervention. She has had her right carpal canal released.     PAST MEDICAL HISTORY: She is allergic to Codeine and E-Mycin. She is on Vivelle. She has had a cholecystectomy, hysterectomy and carpal tunnel release.  FAMILY H ISTORY: Positive for arthritis in her mother.  SOCIAL HISTORY: She does not smoke. She drinks socially. She is married and retired.   REVIEW OF SYSTEMS: Negative for 14 points. [name] is an 67 y.o. female.   Chief Complaint: CTS left CMC arthritis left thumb HPI: see above  Past Medical History  Diagnosis Date  . Medical history non-contributory   . Arthritis   . Complication of anesthesia     mom hard to wake up    Past Surgical History  Procedure Laterality Date  . Abdominal hysterectomy    . Wisdom tooth extraction    . Carpal tunnel release  2001    right  . Cholecystectomy  2004    lap choli    History reviewed. No  pertinent family history. Social History:  reports that she has never smoked. She does not have any smokeless tobacco history on file. She reports that she drinks alcohol. She reports that she does not use illicit drugs.  Allergies:  Allergies  Allergen Reactions  . Codeine Nausea And Vomiting    Medications Prior to Admission  Medication Sig Dispense Refill  . estradiol (VIVELLE-DOT) 0.025 MG/24HR Place 1 patch onto the skin 2 (two) times a week.      . zolpidem (AMBIEN) 5 MG tablet take 1 tablet by mouth at bedtime if needed  30 tablet  5    No results found for this or any previous visit (from the past 48 hour(s)).  No results found.   Pertinent items are noted in HPI.  Height 5\' 3"  (1.6 m), weight 178 lb (80.74 kg).  General appearance: alert, cooperative and appears stated age Head: Normocephalic, without obvious abnormality Neck: no JVD Resp: clear to auscultation bilaterally Cardio: regular rate and rhythm, S1, S2 normal, no murmur, click, rub or gallop GI: soft, non-tender; bowel sounds normal; no masses,  no organomegaly Extremities: extremities normal, atraumatic, no cyanosis or edema Pulses: 2+ and symmetric Skin: Skin color, texture, turgor normal. No rashes or lesions Neurologic: Grossly normal Incision/Wound: na  Assessment/Plan  She would like to simply proceed to surgery. We would not recommend doing any surgical intervention to the [facility] joint. She is in agreement  with this. However, we would recommend injecting the left thumb CMC at the same times as the carpal tunnel release. The pre, peri and post op course are discussed along with risks and complications.  She is aware there is no guarantee with surgery, possibility of infection, recurrence, injury to arteries, nerves and tendons, incomplete relief of symptoms and dystrophy.  She is scheduled for left carpal tunnel release with injection CMC joint left thumb under image intensification.  Bernadett Milian  R 09/07/2013, 9:52 AM

## 2013-09-08 ENCOUNTER — Other Ambulatory Visit: Payer: Self-pay | Admitting: Obstetrics & Gynecology

## 2013-09-08 DIAGNOSIS — N6002 Solitary cyst of left breast: Secondary | ICD-10-CM

## 2013-09-08 NOTE — Op Note (Signed)
NAMEAMERE, IOTT NO.:  [id number]  MEDICAL RECORD NO.:  [id number]  LOCATION:                                 FACILITY:  PHYSICIAN:  Cindee Salt, M.D.            DATE OF BIRTH:  DATE OF PROCEDURE:  09/07/2013 DATE OF DISCHARGE:                              OPERATIVE REPORT   PREOPERATIVE DIAGNOSES:  Carpal tunnel syndrome, left hand.  CMC arthritis, left thumb.  POSTOPERATIVE DIAGNOSES:  Carpal tunnel syndrome, left hand.  CMC arthritis, left thumb.  OPERATION:  Release of left carpal canal with injection to carpometacarpal joint, left thumb.  SURGEON:  Cindee Salt, MD  ANESTHESIA:  Forearm-based IV regional with local infiltration.  ANESTHESIOLOGIST:  Sheldon Silvan, M.D.  HISTORY:  The patient is a 67 year old female with a history of bilateral carpal tunnel.  She has undergone release on the right side. She also has CMC arthritis to left side.  She is desirous of proceeding to have left carpal tunnel release and nerve conductions are positive. She is aware of risks and complications including infection; recurrence of injury to arteries, nerves, tendons, incomplete relief of symptoms, dystrophy.  In the preoperative area, the patient was seen, the extremity marked by both patient and surgeon.  Antibiotic given.  PROCEDURE IN DETAIL:  The patient was brought to the operating room where a forearm-based IV regional anesthetic was carried out without difficulty.  She was prepped using ChloraPrep, supine position, left arm free.  A 3-minute dry time was allowed.  Time-out taken, confirming patient and procedure.  A longitudinal incision was made in the left palm, carried down through subcutaneous tissue.  Bleeders were electrocauterized.  Palmar fascia was split.  Superficial palmar arch identified.  Flexor tendon in the ring and middle finger identified to the ulnar side of the median nerve.  The carpal retinaculum was incised with sharp dissection.   Right angle and Sewall retractor were placed between skin and forearm fascia.  Fascia released for approximately a centimeter and half proximal to the wrist crease under direct vision. Canal was explored.  Air compression of the nerve was apparent.  Motor branch entered into the muscle.  The wound was irrigated and closed with interrupted 4-0 Vicryl Rapide sutures.  The carpometacarpal joint of the thumb was then injected with Celestone and Xylocaine.  A sterile compressive dressing was applied.  On deflation of the tourniquet, all fingers immediately pinked.  She was taken to the recovery room for observation in satisfactory condition.  She will be discharged home to return to the [facility] of [place] in 1 week on [place].          ______________________________ Cindee Salt, M.D.     GK/MEDQ  D:  09/07/2013  T:  09/08/2013  Job:  [id number]

## 2013-09-09 ENCOUNTER — Encounter (HOSPITAL_BASED_OUTPATIENT_CLINIC_OR_DEPARTMENT_OTHER): Payer: Self-pay | Admitting: Orthopedic Surgery

## 2013-09-26 ENCOUNTER — Ambulatory Visit
Admission: RE | Admit: 2013-09-26 | Discharge: 2013-09-26 | Disposition: A | Discharge status: Home / Self Care | Payer: Medicare Other | Source: Ambulatory Visit | Attending: Obstetrics & Gynecology | Admitting: Obstetrics & Gynecology

## 2013-09-26 DIAGNOSIS — N6002 Solitary cyst of left breast: Secondary | ICD-10-CM

## 2013-11-15 ENCOUNTER — Telehealth: Payer: Self-pay | Admitting: Obstetrics & Gynecology

## 2013-11-15 ENCOUNTER — Encounter: Payer: Self-pay | Admitting: Nurse Practitioner

## 2013-11-15 ENCOUNTER — Ambulatory Visit (INDEPENDENT_AMBULATORY_CARE_PROVIDER_SITE_OTHER): Payer: Medicare Other | Admitting: Nurse Practitioner

## 2013-11-15 VITALS — BP 110/62 | HR 80 | Ht 63.0 in | Wt 183.0 lb

## 2013-11-15 DIAGNOSIS — Z780 Asymptomatic menopausal state: Secondary | ICD-10-CM

## 2013-11-15 DIAGNOSIS — Z01419 Encounter for gynecological examination (general) (routine) without abnormal findings: Secondary | ICD-10-CM

## 2013-11-15 MED ORDER — ZOLPIDEM TARTRATE 5 MG PO TABS: ORAL_TABLET | ORAL | Status: DC

## 2013-11-15 MED ORDER — ESTRADIOL 0.025 MG/24HR TD PTTW: 1.0000 | MEDICATED_PATCH | TRANSDERMAL | Status: DC

## 2013-11-15 NOTE — Progress Notes (Signed)
Patient ID: Michelle Price, female   DOB: 1946-10-18, 68 y.o.   MRN: [id number] 68 y.o. [id number] Married Caucasian Fe here for annual exam.  Rare vaso symptoms.  She has now tried to taper off Vivelle dot to every 10-14 days.  Will continue to taper until off.  Still has problems with rectocele.  Now retired from [employer] after 22 years. No LMP recorded. Patient has had a hysterectomy.          Sexually active: yes  The current method of family planning is status post hysterectomy.    Exercising: yes  Home exercise routine includes walking 3 times per week. Smoker:  no  Health Maintenance: Pap:  11/10/12 MMG: 09/26/13, Bi-Rads 1; negative Colonoscopy: never Will schedule in March/ April BMD: 06/06/08, 0.2/-0.6 TDaP:  11/06/2011 Labs:  PCP   reports that she has never smoked. She has never used smokeless tobacco. She reports that she drinks about 2.4 ounces of alcohol per week. She reports that she does not use illicit drugs.  Past Medical History  Diagnosis Date  . Medical history non-contributory   . Arthritis   . Family history of anesthesia complication     mom hard to wake, vocal cord irritation    Past Surgical History  Procedure Laterality Date  . Wisdom tooth extraction    . Carpal tunnel release  2001    right  . Cholecystectomy  2004    lap choli  . Carpal tunnel release Left 09/07/2013    Procedure: LEFT CARPAL TUNNEL RELEASE;  Surgeon: Nicki ReaperGary R Kuzma, MD;  Location: [facility];  Service: Orthopedics;  Laterality: Left;  . Steriod injection Left 09/07/2013    Procedure:  INJECTION LEFT CARPALMETACARPAL THUMB;  Surgeon: Nicki ReaperGary R Kuzma, MD;  Location: [facility];  Service: Orthopedics;  Laterality: Left;  . Abdominal hysterectomy      fibroids    Current Outpatient Prescriptions  Medication Sig Dispense Refill  . estradiol (VIVELLE-DOT) 0.025 MG/24HR Place 1 patch onto the skin 2 (two) times a week.  24 patch  3  . zolpidem (AMBIEN) 5  MG tablet take 1 tablet by mouth at bedtime if needed  30 tablet  5   No current facility-administered medications for this visit.    No family history on file.  ROS:  Pertinent items are noted in HPI.  Otherwise, a comprehensive ROS was negative.  Exam:   BP 110/62  Pulse 80  Ht 5\' 3"  (1.6 m)  Wt 183 lb (83.008 kg)  BMI 32.43 kg/m2 Height: 5\' 3"  (160 cm)  Ht Readings from Last 3 Encounters:  11/15/13 5\' 3"  (1.6 m)  09/07/13 5\' 3"  (1.6 m)  09/07/13 5\' 3"  (1.6 m)    General appearance: alert, cooperative and appears stated age Head: Normocephalic, without obvious abnormality, atraumatic Neck: no adenopathy, supple, symmetrical, trachea midline and thyroid normal to inspection and palpation Lungs: clear to auscultation bilaterally Breasts: normal appearance, no masses or tenderness Heart: regular rate and rhythm Abdomen: soft, non-tender; no masses,  no organomegaly Extremities: extremities normal, atraumatic, no cyanosis or edema Skin: Skin color, texture, turgor normal. No rashes or lesions Lymph nodes: Cervical, supraclavicular, and axillary nodes normal. No abnormal inguinal nodes palpated Neurologic: Grossly normal   Pelvic: External genitalia:  no lesions              Urethra:  normal appearing urethra with no masses, tenderness or lesions  Bartholin's and Skene's: normal                 Vagina: normal appearing vagina with normal color and discharge, no lesions              Cervix: absent              Pap taken: no Bimanual Exam:  Uterus:  uterus absent              Adnexa: no mass, fullness, tenderness               Rectovaginal: Confirms               Anus:  normal sphincter tone, no lesions  A:  Well Woman with normal exam  S/P TAH secondary to fibroids 1986 on ERT  History of rectocele  History of insomnia  P:   Pap smear as per guidelines   Mammogram due 11/15  Order for BMD  Refill on Vivelle dot 0.25 mg and will continue to taper  off.  Refill on Ambien 5 mg to use only prn  Counseled with potential risk of DVT, CVA, cancer, etc.  Counseled on breast self exam, mammography screening, adequate intake of calcium and vitamin D, diet and exercise return annually or prn  An After Visit Summary was printed and given to the patient.

## 2013-11-15 NOTE — Patient Instructions (Signed)

## 2013-11-17 NOTE — Progress Notes (Signed)
I will be happy to evaluate the patient for a rectocele repair if she desires.

## 2013-11-17 NOTE — Progress Notes (Signed)
Encounter reviewed by Dr. Brook Silva.  

## 2013-11-22 ENCOUNTER — Telehealth: Payer: Self-pay | Admitting: Emergency Medicine

## 2013-11-22 NOTE — Telephone Encounter (Signed)
Spoke with patient. She will call back when she has her schedule available.

## 2013-11-22 NOTE — Telephone Encounter (Signed)
Message copied by Joeseph AmorFAST, Jadene Stemmer L on Tue Nov 22, 2013 10:58 AM ------      Message from: Ria CommentGRUBB, PATRICIA R      Created: Mon Nov 21, 2013  6:12 PM      Regarding: FW: Rectocele evaluation       Please call this patient and follow through with her getting a referral to see Dr. Edward JollySilva      ----- Message -----         From: Jacqualin CombesBrook E Amundson de Gwenevere Ghaziarvalho E Silva, MD         Sent: 11/21/2013   9:41 AM           To: Lauro FranklinPatricia Rolen-Grubb, FNP      Subject: Rectocele evaluation                                     Hi Patty,            I will be happy to do a rectocele evlauation for the patient if she desires.            Thanks,            Conley SimmondsBrook Silva, MD       ------

## 2013-11-22 NOTE — Telephone Encounter (Signed)
Patient returning Tracy's call. °

## 2013-11-22 NOTE — Telephone Encounter (Signed)
Spoke with patient. She is agreeable to consult with Dr. Edward JollySilva. Patient requests appointment for end of February. Appointment for 2/25 scheduled. Patient will call back prn.

## 2013-12-28 ENCOUNTER — Ambulatory Visit: Payer: Medicare Other | Admitting: Obstetrics and Gynecology

## 2014-02-08 LAB — HM COLONOSCOPY: HM Colonoscopy: NORMAL

## 2014-05-25 ENCOUNTER — Other Ambulatory Visit: Payer: Self-pay

## 2014-05-25 MED ORDER — ZOLPIDEM TARTRATE 5 MG PO TABS: ORAL_TABLET | ORAL | Status: DC

## 2014-05-25 NOTE — Telephone Encounter (Signed)
Per note patient was to use only as needed not nightly, how is she using medication

## 2014-05-25 NOTE — Telephone Encounter (Signed)
Last AEX: 11/15/13 Last refill:11/15/13 #30, 5 rf Current AEX:11/27/14  Please advise

## 2014-05-25 NOTE — Telephone Encounter (Signed)
S/W pt and she stated she is using the rx 5 to 6 nights a week. Informed the pt that she is only suppose to use prn and not night. Pt agreed and voiced understanding.

## 2014-05-26 NOTE — Telephone Encounter (Signed)
Faxed rx to pharmacy  

## 2014-07-25 ENCOUNTER — Other Ambulatory Visit: Payer: Self-pay

## 2014-07-25 NOTE — Telephone Encounter (Signed)
Last AEX: 11/15/13 Last refill:05/25/14 #30 X 0 Current AEX:11/27/14 Per last refill note pt is using rx 5-6 times a week.  Please advise

## 2014-07-26 NOTE — Telephone Encounter (Signed)
Need chart

## 2014-07-27 MED ORDER — ZOLPIDEM TARTRATE 5 MG PO TABS: ORAL_TABLET | ORAL | Status: DC

## 2014-07-27 NOTE — Telephone Encounter (Signed)
Chart by your door

## 2014-07-27 NOTE — Telephone Encounter (Signed)
Will refill for one month only, she will need further assessment by Patty for continuation, due to only for prn use.

## 2014-07-28 NOTE — Telephone Encounter (Signed)
RX has been faxed to [facility] Encounter Closed

## 2014-08-24 ENCOUNTER — Other Ambulatory Visit: Payer: Self-pay

## 2014-08-24 DIAGNOSIS — Z1231 Encounter for screening mammogram for malignant neoplasm of breast: Secondary | ICD-10-CM

## 2014-09-04 ENCOUNTER — Encounter: Payer: Self-pay | Admitting: Nurse Practitioner

## 2014-09-21 ENCOUNTER — Other Ambulatory Visit: Payer: Self-pay | Admitting: Certified Nurse Midwife

## 2014-09-21 NOTE — Telephone Encounter (Signed)
Last AEX 11/15/13 Last refill 07/27/14 #30/0R Next appt 11/27/14  Please advise

## 2014-10-03 ENCOUNTER — Ambulatory Visit
Admission: RE | Admit: 2014-10-03 | Discharge: 2014-10-03 | Disposition: A | Discharge status: Home / Self Care | Payer: Medicare Other | Source: Ambulatory Visit | Attending: Nurse Practitioner | Admitting: Nurse Practitioner

## 2014-10-03 ENCOUNTER — Ambulatory Visit
Admission: RE | Admit: 2014-10-03 | Discharge: 2014-10-03 | Disposition: A | Discharge status: Home / Self Care | Payer: Medicare Other | Source: Ambulatory Visit

## 2014-10-03 DIAGNOSIS — Z780 Asymptomatic menopausal state: Secondary | ICD-10-CM

## 2014-10-03 DIAGNOSIS — Z1231 Encounter for screening mammogram for malignant neoplasm of breast: Secondary | ICD-10-CM

## 2014-11-27 ENCOUNTER — Encounter: Payer: Self-pay | Admitting: Nurse Practitioner

## 2014-11-27 ENCOUNTER — Ambulatory Visit (INDEPENDENT_AMBULATORY_CARE_PROVIDER_SITE_OTHER): Payer: Medicare Other | Admitting: Nurse Practitioner

## 2014-11-27 VITALS — BP 110/66 | HR 80 | Ht 62.75 in | Wt 163.0 lb

## 2014-11-27 DIAGNOSIS — Z01419 Encounter for gynecological examination (general) (routine) without abnormal findings: Secondary | ICD-10-CM

## 2014-11-27 MED ORDER — ZOLPIDEM TARTRATE 5 MG PO TABS: ORAL_TABLET | ORAL | Status: DC

## 2014-11-27 MED ORDER — ESTRADIOL 0.025 MG/24HR TD PTTW: 1.0000 | MEDICATED_PATCH | TRANSDERMAL | Status: DC

## 2014-11-27 NOTE — Progress Notes (Signed)
Patient ID: Michelle Price, female   DOB: December 17, 1945, 69 y.o.   MRN: [id number] 69 y.o. [id number] Married  Caucasian Fe here for annual exam.  Husband with bladder cancer.  High grade - now on BCG & TURP.  Now losing weight exercise and feels great.  Patient's last menstrual period was 02/01/1985.          Sexually active: Yes.    The current method of family planning is status post hysterectomy secondary to fibroids.  Exercising: Yes.    Home exercise routine includes walking 3 times per week. Smoker:  no  Health Maintenance: Pap:  06/09/01, negative, hysterectomy MMG:  10/03/14, 3D, Bi-Rads 1:  Negative  Colonoscopy:  02/08/14, normal, repeat in 10 years BMD:   10/03/14, T-Score 0.0 S/-1.4 L, 06/06/08, 0.2/-0.6 TDaP:  11/06/11 Shingles: 5/15 Labs: PCP   reports that she has never smoked. She has never used smokeless tobacco. She reports that she drinks about 2.4 oz of alcohol per week. She reports that she does not use illicit drugs.  Past Medical History  Diagnosis Date  . Medical history non-contributory   . Arthritis   . Family history of anesthesia complication     mom hard to wake, vocal cord irritation    Past Surgical History  Procedure Laterality Date  . Wisdom tooth extraction    . Carpal tunnel release  2001    right  . Cholecystectomy  2004    lap choli  . Carpal tunnel release Left 09/07/2013    Procedure: LEFT CARPAL TUNNEL RELEASE;  Surgeon: Nicki Reaper, MD;  Location: [facility];  Service: Orthopedics;  Laterality: Left;  . Steriod injection Left 09/07/2013    Procedure:  INJECTION LEFT CARPALMETACARPAL THUMB;  Surgeon: Nicki Reaper, MD;  Location: [facility];  Service: Orthopedics;  Laterality: Left;  . Abdominal hysterectomy      fibroids    Current Outpatient Prescriptions  Medication Sig Dispense Refill  . estradiol (VIVELLE-DOT) 0.025 MG/24HR Place 1 patch onto the skin 2 (two) times a week. 24 patch 3  . zolpidem (AMBIEN) 5  MG tablet 1 TABLET BY MOUTH AT BEDTIME AS NEEDED 30 tablet 1   No current facility-administered medications for this visit.    History reviewed. No pertinent family history.  ROS:  Pertinent items are noted in HPI.  Otherwise, a comprehensive ROS was negative.  Exam:   BP 110/66 mmHg  Pulse 80  Ht 5' 2.75" (1.594 m)  Wt 163 lb (73.936 kg)  BMI 29.10 kg/m2  LMP 02/01/1985 Height: 5' 2.75" (159.4 cm) Ht Readings from Last 3 Encounters:  11/27/14 5' 2.75" (1.594 m)  11/15/13  (1.6 m)  09/07/13  (1.6 m)    General appearance: alert, cooperative and appears stated age Head: Normocephalic, without obvious abnormality, atraumatic Neck: no adenopathy, supple, symmetrical, trachea midline and thyroid normal to inspection and palpation Lungs: clear to auscultation bilaterally Breasts: normal appearance, no masses or tenderness Heart: regular rate and rhythm Abdomen: soft, non-tender; no masses,  no organomegaly Extremities: extremities normal, atraumatic, no cyanosis or edema Skin: Skin color, texture, turgor normal. No rashes or lesions Lymph nodes: Cervical, supraclavicular, and axillary nodes normal. No abnormal inguinal nodes palpated Neurologic: Grossly normal   Pelvic: External genitalia:  no lesions              Urethra:  normal appearing urethra with no masses, tenderness or lesions  Bartholin's and Skene's: normal                 Vagina: normal appearing vagina with normal color and discharge, no lesions              Cervix: absent              Pap taken: No. Bimanual Exam:  Uterus:  uterus absent              Adnexa: no mass, fullness, tenderness               Rectovaginal: Confirms               Anus:  normal sphincter tone, no lesions, rectocele without change  Chaperone present: Yes/NO  A:  Well Woman with normal exam  S/P TAH secondary to fibroids 1986 on ERT History of rectocele History of insomnia  P:   Reviewed  health and wellness pertinent to exam  Pap smear not taken today  Mammogram is due 12/16  Refill on Vivelle dot 0.025 mg once to twice weekly  Counseled with risk of DVT, CVA, cancer, etc.  Refill Ambien 5 mg to use prn for 6 months  Counseled on breast self exam, mammography screening, use and side effects of HRT, adequate intake of calcium and vitamin D, diet and exercise, Kegel's exercises return annually or prn  An After Visit Summary was printed and given to the patient.

## 2014-11-27 NOTE — Patient Instructions (Signed)

## 2014-11-28 NOTE — Progress Notes (Signed)
Reviewed personally.  M. Suzanne Lismary Kiehn, MD.  

## 2015-07-16 ENCOUNTER — Other Ambulatory Visit: Payer: Self-pay | Admitting: Nurse Practitioner

## 2015-07-16 NOTE — Telephone Encounter (Signed)
Medication refill request: Ambien Last AEX:  11/27/14 PG Next AEX: 12/03/15 Last MMG (if hormonal medication request): 10/14/14 BIRADS1:Neg Refill authorized: 11/27/14 #30tabs/ 5R. Rx expired.   Please advise.

## 2015-12-03 ENCOUNTER — Ambulatory Visit (INDEPENDENT_AMBULATORY_CARE_PROVIDER_SITE_OTHER): Payer: Medicare Other | Admitting: Nurse Practitioner

## 2015-12-03 ENCOUNTER — Encounter: Payer: Self-pay | Admitting: Nurse Practitioner

## 2015-12-03 VITALS — BP 124/76 | HR 64 | Ht 62.5 in | Wt 166.0 lb

## 2015-12-03 DIAGNOSIS — E559 Vitamin D deficiency, unspecified: Secondary | ICD-10-CM

## 2015-12-03 DIAGNOSIS — Z0000 Encounter for general adult medical examination without abnormal findings: Secondary | ICD-10-CM

## 2015-12-03 DIAGNOSIS — Z01419 Encounter for gynecological examination (general) (routine) without abnormal findings: Secondary | ICD-10-CM | POA: Diagnosis not present

## 2015-12-03 LAB — HIV ANTIBODY (ROUTINE TESTING W REFLEX): HIV: NONREACTIVE

## 2015-12-03 LAB — HEPATITIS C ANTIBODY: HCV Ab: NEGATIVE

## 2015-12-03 MED ORDER — ZOLPIDEM TARTRATE 5 MG PO TABS: 5.0000 mg | ORAL_TABLET | Freq: Every evening | ORAL | Status: DC | PRN

## 2015-12-03 MED ORDER — NONFORMULARY OR COMPOUNDED ITEM: Status: DC

## 2015-12-03 MED ORDER — ESTRADIOL 0.025 MG/24HR TD PTTW: 1.0000 | MEDICATED_PATCH | TRANSDERMAL | Status: DC

## 2015-12-03 NOTE — Progress Notes (Signed)
Encounter reviewed by Dr. Janean Sark.  I would consider having patient see neurology regarding sleep disturbance if this has not been previously done.

## 2015-12-03 NOTE — Patient Instructions (Signed)

## 2015-12-03 NOTE — Progress Notes (Signed)
Patient ID: Michelle Price, female   DOB: 1946/07/17, 70 y.o.   MRN: [id number]  70 y.o. [id number] Married  Caucasian Fe here for annual exam. New diagnosis of bilateral cataract.  Last lipid panel 09/2012 total cholesterol 156; HDL 58; trig 77; LDL 98.  .  Last Vit D @ 43 in 2010.  Will check this again.  She is also tapering her Vivelle dot to one dot every 7-10 days and plans to taper off maybe by spring.  Husband with his bladder cancer is in remission.  Patient's last menstrual period was 02/01/1985 (approximate).          Sexually active: Yes.    The current method of family planning is status post hysterectomy.    Exercising: Yes.    walking 2-3 miles daily, and upper body weights Smoker:  no  Health Maintenance: Pap: 06/09/01, Negative (TAH) MMG: 10/03/14, 3D, Bi-Rads 1: Negative Colonoscopy: 02/08/14, hemorrhoids, diverticulosis, repeat in 10 years BMD: 10/03/14 T Score, 0.0 Spine / -1.4 Left Femur Neck TDaP: 11/06/11 Shingles: 03/23/14  Pneumonia: 08/03/12 #1, has not had Prevnar 13 Hep C and HIV:  Will do today Labs: Gweneth Dimitri, MD   reports that she has never smoked. She has never used smokeless tobacco. She reports that she drinks about 2.4 oz of alcohol per week. She reports that she does not use illicit drugs.  Past Medical History  Diagnosis Date  . Medical history non-contributory   . Arthritis   . Family history of anesthesia complication     mom hard to wake, vocal cord irritation    Past Surgical History  Procedure Laterality Date  . Wisdom tooth extraction    . Carpal tunnel release  2001    right  . Cholecystectomy  2004    lap choli  . Carpal tunnel release Left 09/07/2013    Procedure: LEFT CARPAL TUNNEL RELEASE;  Surgeon: Nicki Reaper, MD;  Location: [facility];  Service: Orthopedics;  Laterality: Left;  . Steriod injection Left 09/07/2013    Procedure:  INJECTION LEFT CARPALMETACARPAL THUMB;  Surgeon: Nicki Reaper, MD;  Location: [facility];  Service: Orthopedics;  Laterality: Left;  . Abdominal hysterectomy  02/01/1985    fibroids    Current Outpatient Prescriptions  Medication Sig Dispense Refill  . estradiol (VIVELLE-DOT) 0.025 MG/24HR Place 1 patch onto the skin 2 (two) times a week. 24 patch 3  . zolpidem (AMBIEN) 5 MG tablet Take 1 tablet (5 mg total) by mouth at bedtime as needed. 30 tablet 5  . NONFORMULARY OR COMPOUNDED ITEM Triamcinolone 0.1% and Silvadene Cream 3:1 compound and use BID prn 4 oz. 45 each 3   No current facility-administered medications for this visit.    Family History  Problem Relation Age of Onset  . Diabetes Father   . COPD Father   . Diabetes Brother   . Heart failure Paternal Grandmother   . Diabetes Paternal Grandfather     ROS:  Pertinent items are noted in HPI.  Otherwise, a comprehensive ROS was negative.  Exam:   BP 124/76 mmHg  Pulse 64  Ht 5' 2.5" (1.588 m)  Wt 166 lb (75.297 kg)  BMI 29.86 kg/m2  LMP 02/01/1985 (Approximate) Height: 5' 2.5" (158.8 cm) Ht Readings from Last 3 Encounters:  12/03/15 5' 2.5" (1.588 m)  11/27/14 5' 2.75" (1.594 m)  11/15/13  (1.6 m)    General appearance: alert, cooperative and appears stated age Head: Normocephalic,  without obvious abnormality, atraumatic Neck: no adenopathy, supple, symmetrical, trachea midline and thyroid normal to inspection and palpation Lungs: clear to auscultation bilaterally Breasts: normal appearance, no masses or tenderness Heart: regular rate and rhythm Abdomen: soft, non-tender; no masses,  no organomegaly Extremities: extremities normal, atraumatic, no cyanosis or edema Skin: Skin color, texture, turgor normal. No rashes or lesions Lymph nodes: Cervical, supraclavicular, and axillary nodes normal. No abnormal inguinal nodes palpated Neurologic: Grossly normal   Pelvic: External genitalia:  no lesions              Urethra:  normal appearing urethra with no masses, tenderness or  lesions              Bartholin's and Skene's: normal                 Vagina: normal appearing vagina with normal color and discharge, no lesions, 2 Nd degree rectocele              Cervix: absent              Pap taken: No. Bimanual Exam:  Uterus:  uterus absent              Adnexa: no mass, fullness, tenderness               Rectovaginal: Confirms               Anus:  normal sphincter tone, no lesions  Chaperone present: yes  A:  Well Woman with normal exam  S/P TAH secondary to fibroids 1986 on ERT History of rectocele History of insomnia   P:   Reviewed health and wellness pertinent to exam  Pap smear as above  Mammogram is due and will schedule now - thought she was on a 2 yr cycle or would have already done the Mammo.  Refill on Vivelle dot 0.025 mg for a year  Counseled with risk of DVT, CVA, cancer, etc  Refill on Ambien 5 mg. 1/2 - 1 tablet only prn  Refill on her Triamcinolone cream and Silvadene  Follow with labs  Counseled on breast self exam, mammography screening, use and side effects of HRT, adequate intake of calcium and vitamin D, diet and exercise, Kegel's exercises return annually or prn  An After Visit Summary was printed and given to the patient.

## 2015-12-04 ENCOUNTER — Encounter: Payer: Self-pay | Admitting: Nurse Practitioner

## 2015-12-04 ENCOUNTER — Other Ambulatory Visit: Payer: Self-pay | Admitting: Nurse Practitioner

## 2015-12-04 DIAGNOSIS — E559 Vitamin D deficiency, unspecified: Secondary | ICD-10-CM

## 2015-12-04 LAB — VITAMIN D 25 HYDROXY (VIT D DEFICIENCY, FRACTURES): Vit D, 25-Hydroxy: 15 ng/mL — ABNORMAL LOW (ref 30–100)

## 2015-12-04 MED ORDER — VITAMIN D (ERGOCALCIFEROL) 1.25 MG (50000 UNIT) PO CAPS: 50000.0000 [IU] | ORAL_CAPSULE | ORAL | Status: DC

## 2015-12-05 ENCOUNTER — Other Ambulatory Visit: Payer: Self-pay

## 2015-12-05 ENCOUNTER — Encounter: Payer: Self-pay | Admitting: Nurse Practitioner

## 2015-12-05 DIAGNOSIS — Z1231 Encounter for screening mammogram for malignant neoplasm of breast: Secondary | ICD-10-CM

## 2015-12-06 ENCOUNTER — Encounter: Payer: Self-pay | Admitting: Nurse Practitioner

## 2015-12-19 ENCOUNTER — Ambulatory Visit
Admission: RE | Admit: 2015-12-19 | Discharge: 2015-12-19 | Disposition: A | Discharge status: Home / Self Care | Payer: Medicare Other | Source: Ambulatory Visit

## 2015-12-19 DIAGNOSIS — Z1231 Encounter for screening mammogram for malignant neoplasm of breast: Secondary | ICD-10-CM

## 2016-02-20 ENCOUNTER — Other Ambulatory Visit: Payer: Self-pay | Admitting: Internal Medicine

## 2016-02-21 ENCOUNTER — Other Ambulatory Visit: Payer: Self-pay | Admitting: Internal Medicine

## 2016-02-21 DIAGNOSIS — I77819 Aortic ectasia, unspecified site: Secondary | ICD-10-CM

## 2016-02-22 ENCOUNTER — Ambulatory Visit
Admission: RE | Admit: 2016-02-22 | Discharge: 2016-02-22 | Disposition: A | Discharge status: Home / Self Care | Payer: Medicare Other | Source: Ambulatory Visit | Attending: Internal Medicine | Admitting: Internal Medicine

## 2016-02-22 DIAGNOSIS — I77819 Aortic ectasia, unspecified site: Secondary | ICD-10-CM

## 2016-02-27 ENCOUNTER — Telehealth: Payer: Self-pay | Admitting: Nurse Practitioner

## 2016-02-27 NOTE — Telephone Encounter (Signed)
Spoke with patient. Advised of message as seen below from Patricia Grubb, FNP. She is agreeable and verbalizes understanding.  Routing to provider for final review. Patient agreeable to disposition. Will close encounter.  

## 2016-02-27 NOTE — Telephone Encounter (Signed)
Patient called to cancel her vitamin D recheck on 03/03/16. She had this tested at her PCP's office and brought in her results to our office for Patty to review last week. She needs to know Patty's instructions for taking this going forward.

## 2016-02-27 NOTE — Telephone Encounter (Signed)
Patient's last Vitamin D was drawn at our office on 12/03/2015 and was 15. She was started on Vitamin D 50,000 IU take 1 tablet q 7 days for 3 months. She had her Vitamin D rechecked with her PCP and level was 46.1 (please see scanned results in EPIC). Patient calling for further recommendations with how to proceed with taking Vitamin D.

## 2016-02-27 NOTE — Telephone Encounter (Signed)
She may now go to OTC Vit D 1000 IU daily.  Will recheck at next AEX.

## 2016-03-03 ENCOUNTER — Other Ambulatory Visit: Payer: Medicare Other

## 2016-03-07 ENCOUNTER — Other Ambulatory Visit: Payer: Self-pay | Admitting: Nurse Practitioner

## 2016-03-10 NOTE — Telephone Encounter (Signed)
12/03/15 #30/[facility] should still have refills.-rx refused.

## 2016-08-01 ENCOUNTER — Other Ambulatory Visit: Payer: Self-pay

## 2016-08-01 MED ORDER — ZOLPIDEM TARTRATE 5 MG PO TABS: 5.0000 mg | ORAL_TABLET | Freq: Every evening | ORAL | 5 refills | Status: DC | PRN

## 2016-08-01 NOTE — Telephone Encounter (Signed)
Prescription for Zolpidem was faxed to [facility]. -[place]

## 2016-08-01 NOTE — Telephone Encounter (Signed)
Medication refill request: Ambien 5mg  Last AEX:  12/03/15 PG Next AEX: 12/08/16  Last MMG (if hormonal medication request): 12/19/15 BIRADS1 negative Refill authorized: 12/03/15 #30 w/5 refills;today please advise

## 2016-11-11 ENCOUNTER — Other Ambulatory Visit: Payer: Self-pay | Admitting: Nurse Practitioner

## 2016-11-11 ENCOUNTER — Other Ambulatory Visit: Payer: Self-pay | Admitting: Obstetrics and Gynecology

## 2016-12-08 ENCOUNTER — Encounter: Payer: Self-pay | Admitting: Nurse Practitioner

## 2016-12-08 ENCOUNTER — Telehealth: Payer: Self-pay | Admitting: Nurse Practitioner

## 2016-12-08 ENCOUNTER — Ambulatory Visit (INDEPENDENT_AMBULATORY_CARE_PROVIDER_SITE_OTHER): Payer: Medicare Other | Admitting: Nurse Practitioner

## 2016-12-08 VITALS — BP 122/76 | HR 80 | Ht 62.25 in | Wt 164.0 lb

## 2016-12-08 DIAGNOSIS — N951 Menopausal and female climacteric states: Secondary | ICD-10-CM | POA: Diagnosis not present

## 2016-12-08 DIAGNOSIS — Z79890 Hormone replacement therapy (postmenopausal): Secondary | ICD-10-CM

## 2016-12-08 DIAGNOSIS — E2839 Other primary ovarian failure: Secondary | ICD-10-CM | POA: Diagnosis not present

## 2016-12-08 DIAGNOSIS — Z01411 Encounter for gynecological examination (general) (routine) with abnormal findings: Secondary | ICD-10-CM

## 2016-12-08 DIAGNOSIS — Z0000 Encounter for general adult medical examination without abnormal findings: Secondary | ICD-10-CM

## 2016-12-08 DIAGNOSIS — E559 Vitamin D deficiency, unspecified: Secondary | ICD-10-CM | POA: Diagnosis not present

## 2016-12-08 MED ORDER — ZOLPIDEM TARTRATE 5 MG PO TABS: ORAL_TABLET | ORAL | 5 refills | Status: AC

## 2016-12-08 MED ORDER — NONFORMULARY OR COMPOUNDED ITEM: 3 refills | Status: AC

## 2016-12-08 MED ORDER — ESTRADIOL 0.025 MG/24HR TD PTTW: 1.0000 | MEDICATED_PATCH | TRANSDERMAL | 3 refills | Status: DC

## 2016-12-08 NOTE — Telephone Encounter (Signed)
Pt is called and it is recommended that she go ahead and stop the Vivelle dot due to diagnosis of AAA.  Per Up to Date this increases her risk factors.  Pt is OK with plan.

## 2016-12-08 NOTE — Patient Instructions (Signed)

## 2016-12-08 NOTE — Progress Notes (Addendum)
Patient ID: Michelle Price, female   DOB: May 02, 1946, 71 y.o.   MRN: [id number]  71 y.o. [id number] Married  Caucasian Fe here for annual exam.  Wants to have Vit D rechecked.she is having a vaginal odor that is not urine.  No discharge or itching.   Husband who has bladder cancer is stable.  They are trying to travel about every 2 - 3 months and enjoying each other. # 8 grand baby now 123 weeks old.  Patient's last menstrual period was 02/01/1985 (approximate).          Sexually active: Yes.    The current method of family planning is status post hysterectomy.    Exercising: Yes.    Home exercise routine includes walking 2-3 miles 5-6 days per week. Smoker:  no  Health Maintenance: Pap: 06/09/01, Negative (TAH) MMG: 12/19/15, 3D, Bi-Rads 1: Negative, will schedule with BMD Colonoscopy: 02/08/14, hemorrhoids, diverticulosis, repeat in 10 years BMD: 10/03/14 T Score, 0.0 Spine / -1.4 Left Femur Neck TDaP: 11/06/11 Shingles: 03/23/14         Pneumonia: 08/03/12 Pneumovax, 12/26/15 Prevnar-13 Hep C: 12/03/15 Labs: PCP takes care of all labs.   reports that she has never smoked. She has never used smokeless tobacco. She reports that she drinks about 2.4 oz of alcohol per week . She reports that she does not use drugs.  Past Medical History:  Diagnosis Date  . Arthritis   . Family history of anesthesia complication    mom hard to wake, vocal cord irritation  . Medical history non-contributory     Past Surgical History:  Procedure Laterality Date  . ABDOMINAL HYSTERECTOMY  02/01/1985   fibroids  . CARPAL TUNNEL RELEASE  2001   right  . CARPAL TUNNEL RELEASE Left 09/07/2013   Procedure: LEFT CARPAL TUNNEL RELEASE;  Surgeon: Nicki ReaperGary R Kuzma, MD;  Location: [facility];  Service: Orthopedics;  Laterality: Left;  . CHOLECYSTECTOMY  2004   lap choli  . STERIOD INJECTION Left 09/07/2013   Procedure:  INJECTION LEFT CARPALMETACARPAL THUMB;  Surgeon: Nicki ReaperGary R Kuzma, MD;  Location: [facility];  Service: Orthopedics;  Laterality: Left;  . WISDOM TOOTH EXTRACTION      Current Outpatient Prescriptions  Medication Sig Dispense Refill  . Cholecalciferol (VITAMIN D-3) 1000 units CAPS Take 1 capsule by mouth daily.    Marland Kitchen. estradiol (VIVELLE-DOT) 0.025 MG/24HR Place 1 patch onto the skin 2 (two) times a week. 24 patch 3  . NONFORMULARY OR COMPOUNDED ITEM Triamcinolone 0.1% and Silvadene Cream 3:1 compound and use BID prn 4 oz. 45 each 3  . zolpidem (AMBIEN) 5 MG tablet Take 1 tablet (5 mg total) by mouth at bedtime as needed. 30 tablet 5   No current facility-administered medications for this visit.     Family History  Problem Relation Age of Onset  . Diabetes Father   . COPD Father   . Diabetes Brother   . Heart failure Paternal Grandmother   . Diabetes Paternal Grandfather     ROS:  Pertinent items are noted in HPI.  Otherwise, a comprehensive ROS was negative.  Exam:   BP 122/76 (BP Location: Right Arm, Patient Position: Sitting, Cuff Size: Normal)   Pulse 80   Ht 5' 2.25" (1.581 m)   Wt 164 lb (74.4 kg)   LMP 02/01/1985 (Approximate)   BMI 29.76 kg/m  Height: 5' 2.25" (158.1 cm) Ht Readings from Last 3 Encounters:  12/08/16 5' 2.25" (1.581 m)  12/03/15 5' 2.5" (1.588 m)  11/27/14 5' 2.75" (1.594 m)    General appearance: alert, cooperative and appears stated age Head: Normocephalic, without obvious abnormality, atraumatic Neck: no adenopathy, supple, symmetrical, trachea midline and thyroid normal to inspection and palpation Lungs: clear to auscultation bilaterally Breasts: normal appearance, no masses or tenderness Heart: regular rate and rhythm Abdomen: soft, non-tender; no masses,  no organomegaly Extremities: extremities normal, atraumatic, no cyanosis or edema Skin: Skin color, texture, turgor normal. No rashes or lesions Lymph nodes: Cervical, supraclavicular, and axillary nodes normal. No abnormal inguinal nodes palpated Neurologic:  Grossly normal   Pelvic: External genitalia:  no lesions              Urethra:  normal appearing urethra with no masses, tenderness or lesions              Bartholin's and Skene's: normal                 Vagina: atrophic appearing vagina with normal color and discharge, no lesions              Cervix: absent              Pap taken: No. Bimanual Exam:  Uterus:  uterus absent              Adnexa: no mass, fullness, tenderness               Rectovaginal: Confirms               Anus:  normal sphincter tone, no lesions  Chaperone present: yes  A:  Well Woman with normal exam   S/P TAH secondary to fibroids 1986 on ERT - now will DC History of rectocele History of insomnia with travel  New finding of borderline AAA and followed by PCP   P:   Reviewed health and wellness pertinent to exam  Pap smear not done  Mammogram is due this month and will get BMD done as well  Refill on Triamcinolone and Silvadene to use prn  Refill on Ambien to use only prn  Review of Up To Date gives caution and increase risk with use of Vivelle dot so medication has been stopped.  Pt OK with plan.  Counseled with risk of DVT, CVA, Cancer, etc  Counseled on breast self exam, mammography screening, use and side effects of HRT, adequate intake of calcium and vitamin D, diet and exercise, Kegel's exercises return annually or prn  An After Visit Summary was printed and given to the patient.

## 2016-12-09 ENCOUNTER — Encounter: Payer: Self-pay | Admitting: Nurse Practitioner

## 2016-12-09 LAB — VITAMIN D 25 HYDROXY (VIT D DEFICIENCY, FRACTURES): Vit D, 25-Hydroxy: 36 ng/mL (ref 30–100)

## 2016-12-09 NOTE — Progress Notes (Signed)
Encounter reviewed by Dr. Janean SarkBrook Amundson C. Silva. I recommend consideration of consultation with neurology for chronic insomnia. Hx long term Ambien prescription.

## 2016-12-18 ENCOUNTER — Other Ambulatory Visit: Payer: Self-pay | Admitting: Nurse Practitioner

## 2016-12-18 DIAGNOSIS — Z1231 Encounter for screening mammogram for malignant neoplasm of breast: Secondary | ICD-10-CM

## 2017-01-09 ENCOUNTER — Ambulatory Visit
Admission: RE | Admit: 2017-01-09 | Discharge: 2017-01-09 | Disposition: A | Discharge status: Home / Self Care | Payer: Medicare Other | Source: Ambulatory Visit | Attending: Nurse Practitioner | Admitting: Nurse Practitioner

## 2017-01-09 DIAGNOSIS — E2839 Other primary ovarian failure: Secondary | ICD-10-CM

## 2017-01-09 DIAGNOSIS — Z1231 Encounter for screening mammogram for malignant neoplasm of breast: Secondary | ICD-10-CM

## 2017-01-12 ENCOUNTER — Encounter: Payer: Self-pay | Admitting: Nurse Practitioner

## 2017-01-13 ENCOUNTER — Telehealth: Payer: Self-pay

## 2017-01-13 NOTE — Telephone Encounter (Signed)
Visit Follow-Up Question  Message [id number]  From Cannon Kettleheresa R Giuliani To Ria CommentPatricia Grubb, FNP Sent 01/12/2017 11:22 AM  I have a question about DG BONE DENSITY resulted on 01/09/17 at 11:47 AM.  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  How much should I now be taking?   Enjoy the pretty snow from inside today!   Responsible Party   Pool - [facility] Clinical Pool Message taken by Ginny ForthKaitlyn E Sprague, RN on 01/13/2017 12:29 PM  No actions have been taken on this message.   Routing to Ria CommentPatricia Grubb, FNP for review and advise.

## 2017-01-13 NOTE — Telephone Encounter (Signed)
Telephone encounter created to review with Patricia Grubb, FNP. 

## 2017-01-13 NOTE — Telephone Encounter (Signed)
She is correct do not add back more calcium by supplement - get calcium from dietary sources only.

## 2017-01-14 NOTE — Telephone Encounter (Signed)
Spoke with patient. Advised of message as seen below from Ria CommentPatricia Grubb, FNP. Patient verbalizes understanding. Patient states that when reviewing her BMD results she noticed that her weight was listed as 178 lbs and when she was weight at the imaging center the scale read 168 lbs. Feels this was entered incorrectly and is asking if this will interfere with her results? Advised I will review with Ria CommentPatricia Grubb, FNP and return call. Patient is agreeable.

## 2017-02-02 ENCOUNTER — Other Ambulatory Visit: Payer: Self-pay | Admitting: Nurse Practitioner

## 2017-02-13 NOTE — Telephone Encounter (Signed)
Routing to Patricia Grubb, FNP for review and advise. 

## 2017-02-16 NOTE — Telephone Encounter (Signed)
Can we call imaging center and have them to correct the weight ans ask if that changes the outcome of recommendations.

## 2017-02-16 NOTE — Telephone Encounter (Signed)
Call to patient. Advised of update from [facility] and no change in results with amended weight value. Patient appreciative.   Routing to provider for final review. Patient agreeable to disposition. Will close encounter.

## 2017-02-16 NOTE — Telephone Encounter (Signed)
Call to [facility]. Spoke to [employer]. Advised Cherish of patient's concern regarding weight entry.  Need to know what effect this would have on result if her weight was actually 168 lbs. Cherish checked with tech that performed study. Results were recalculated with weight of 168 lbs as patient indicates scale stated.  There was no change in the result with the reduced weight.   Information will be relayed to Dr Manson Passey that read study and amended report will be issued when she returns to office.

## 2017-03-04 ENCOUNTER — Other Ambulatory Visit: Payer: Self-pay | Admitting: Internal Medicine

## 2017-03-04 DIAGNOSIS — R011 Cardiac murmur, unspecified: Secondary | ICD-10-CM

## 2017-03-10 ENCOUNTER — Other Ambulatory Visit (HOSPITAL_COMMUNITY): Payer: Medicare Other

## 2017-03-11 ENCOUNTER — Other Ambulatory Visit: Payer: Self-pay

## 2017-03-11 ENCOUNTER — Ambulatory Visit (HOSPITAL_COMMUNITY): Payer: Medicare Other | Attending: Cardiovascular Disease

## 2017-03-11 DIAGNOSIS — I517 Cardiomegaly: Secondary | ICD-10-CM | POA: Diagnosis not present

## 2017-03-11 DIAGNOSIS — R011 Cardiac murmur, unspecified: Secondary | ICD-10-CM | POA: Insufficient documentation

## 2017-12-09 ENCOUNTER — Ambulatory Visit: Payer: Medicare Other | Admitting: Nurse Practitioner

## 2018-02-02 ENCOUNTER — Other Ambulatory Visit: Payer: Self-pay | Admitting: Internal Medicine

## 2018-02-02 DIAGNOSIS — Z1231 Encounter for screening mammogram for malignant neoplasm of breast: Secondary | ICD-10-CM

## 2018-03-03 ENCOUNTER — Ambulatory Visit: Payer: Medicare Other

## 2018-03-23 ENCOUNTER — Ambulatory Visit: Payer: Medicare Other

## 2018-03-24 ENCOUNTER — Ambulatory Visit
Admission: RE | Admit: 2018-03-24 | Discharge: 2018-03-24 | Disposition: A | Discharge status: Home / Self Care | Payer: Medicare Other | Source: Ambulatory Visit | Attending: Internal Medicine | Admitting: Internal Medicine

## 2018-03-24 DIAGNOSIS — Z1231 Encounter for screening mammogram for malignant neoplasm of breast: Secondary | ICD-10-CM

## 2019-04-01 ENCOUNTER — Other Ambulatory Visit: Payer: Self-pay | Admitting: Internal Medicine

## 2019-04-01 DIAGNOSIS — Z1231 Encounter for screening mammogram for malignant neoplasm of breast: Secondary | ICD-10-CM

## 2019-04-04 ENCOUNTER — Ambulatory Visit
Admission: RE | Admit: 2019-04-04 | Discharge: 2019-04-04 | Disposition: A | Discharge status: Home / Self Care | Payer: Medicare Other | Source: Ambulatory Visit | Attending: Internal Medicine | Admitting: Internal Medicine

## 2019-04-04 ENCOUNTER — Other Ambulatory Visit: Payer: Self-pay

## 2019-04-04 DIAGNOSIS — Z1231 Encounter for screening mammogram for malignant neoplasm of breast: Secondary | ICD-10-CM

## 2019-05-31 ENCOUNTER — Other Ambulatory Visit: Payer: Self-pay | Admitting: Internal Medicine

## 2019-06-03 ENCOUNTER — Other Ambulatory Visit: Payer: Self-pay | Admitting: Internal Medicine

## 2019-06-03 DIAGNOSIS — E7849 Other hyperlipidemia: Secondary | ICD-10-CM

## 2019-06-15 ENCOUNTER — Other Ambulatory Visit: Payer: Medicare Other

## 2019-06-27 ENCOUNTER — Other Ambulatory Visit: Payer: Medicare Other

## 2019-07-25 ENCOUNTER — Ambulatory Visit
Admission: RE | Admit: 2019-07-25 | Discharge: 2019-07-25 | Disposition: A | Discharge status: Home / Self Care | Payer: No Typology Code available for payment source | Source: Ambulatory Visit | Attending: Internal Medicine | Admitting: Internal Medicine

## 2019-07-25 DIAGNOSIS — E7849 Other hyperlipidemia: Secondary | ICD-10-CM

## 2020-07-18 ENCOUNTER — Other Ambulatory Visit (HOSPITAL_COMMUNITY): Payer: Self-pay | Admitting: Internal Medicine

## 2020-07-18 DIAGNOSIS — I77819 Aortic ectasia, unspecified site: Secondary | ICD-10-CM

## 2020-07-19 ENCOUNTER — Other Ambulatory Visit: Payer: Self-pay

## 2020-07-19 ENCOUNTER — Ambulatory Visit (HOSPITAL_COMMUNITY)
Admission: RE | Admit: 2020-07-19 | Discharge: 2020-07-19 | Disposition: A | Discharge status: Home / Self Care | Payer: Medicare Other | Source: Ambulatory Visit | Attending: Internal Medicine | Admitting: Internal Medicine

## 2020-07-19 DIAGNOSIS — I77819 Aortic ectasia, unspecified site: Secondary | ICD-10-CM

## 2020-07-31 ENCOUNTER — Other Ambulatory Visit: Payer: Self-pay | Admitting: Internal Medicine

## 2020-07-31 DIAGNOSIS — Z0000 Encounter for general adult medical examination without abnormal findings: Secondary | ICD-10-CM

## 2020-08-07 ENCOUNTER — Other Ambulatory Visit: Payer: Self-pay | Admitting: Internal Medicine

## 2020-08-07 DIAGNOSIS — M8580 Other specified disorders of bone density and structure, unspecified site: Secondary | ICD-10-CM

## 2020-08-17 ENCOUNTER — Ambulatory Visit
Admission: RE | Admit: 2020-08-17 | Discharge: 2020-08-17 | Disposition: A | Discharge status: Home / Self Care | Payer: Medicare Other | Source: Ambulatory Visit | Attending: Internal Medicine | Admitting: Internal Medicine

## 2020-08-17 ENCOUNTER — Other Ambulatory Visit: Payer: Self-pay

## 2020-08-17 DIAGNOSIS — Z0000 Encounter for general adult medical examination without abnormal findings: Secondary | ICD-10-CM

## 2020-09-14 ENCOUNTER — Other Ambulatory Visit: Payer: Medicare Other

## 2020-12-04 IMAGING — MG DIGITAL SCREENING BILATERAL MAMMOGRAM WITH TOMO AND CAD
8 series · 9 of 24 positions shown · non-contrast
Comparison: Previous exam(s).

CLINICAL DATA: Screening.

EXAM:
DIGITAL SCREENING BILATERAL MAMMOGRAM WITH TOMO AND CAD

[L MLO synth-2D]
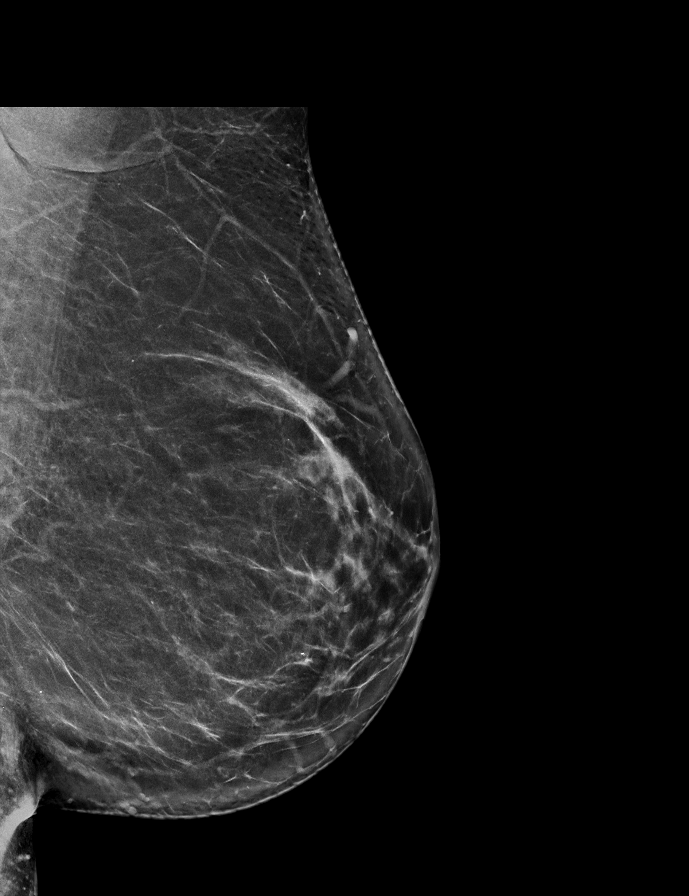

[R MLO synth-2D]
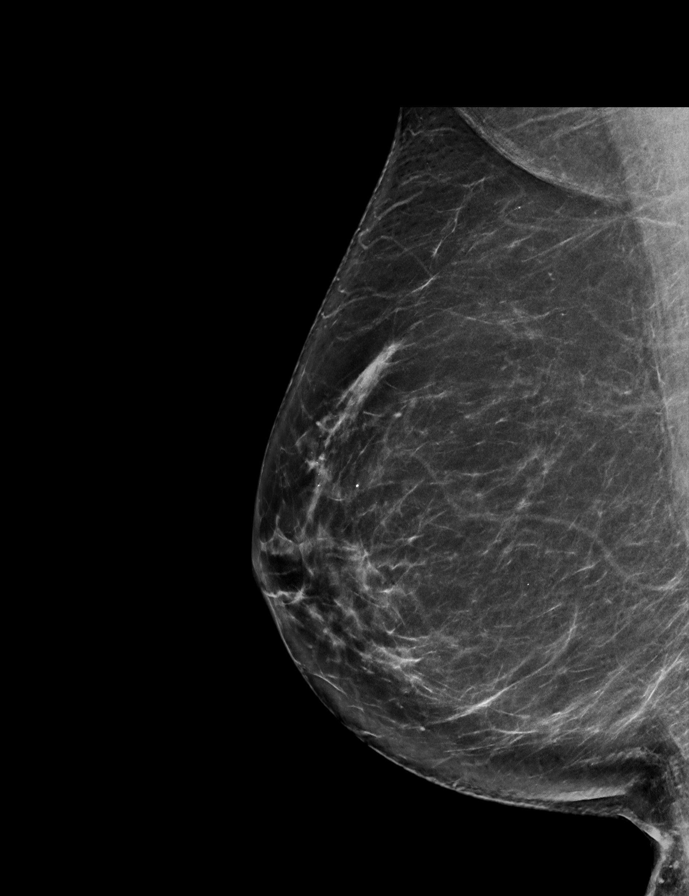

[L CC synth-2D]
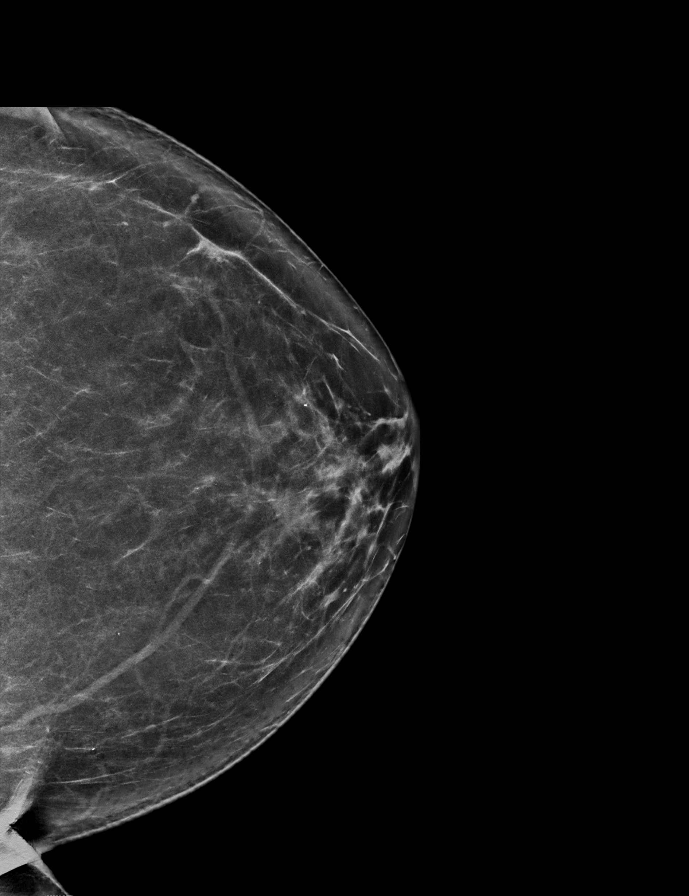

[R CC synth-2D]
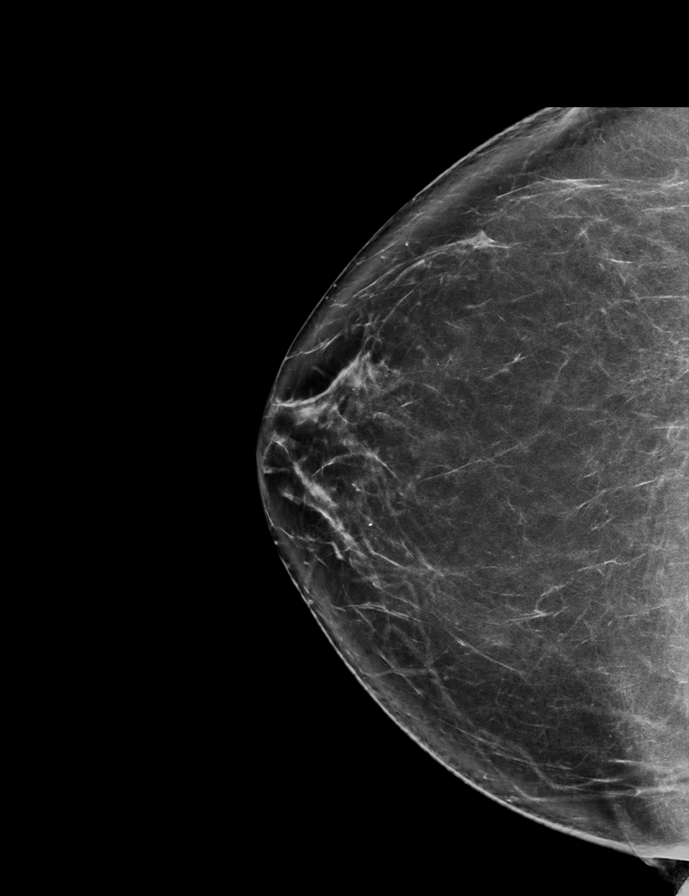

[R MLO tomo · 2 of 80 frames shown]
[frame 26/80]
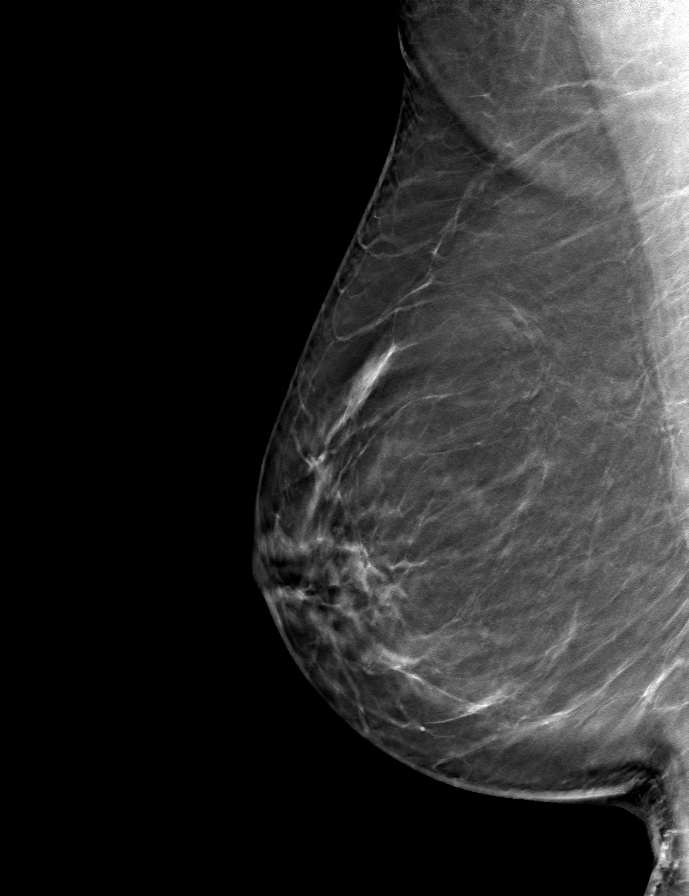
[frame 41/80]
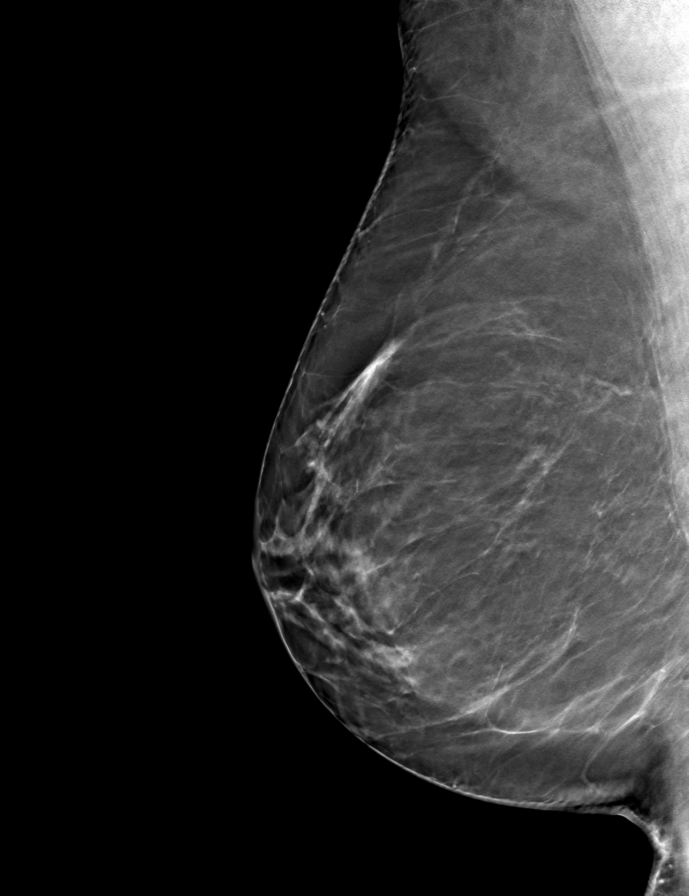

[R CC tomo · tomo slice 41/80.0]
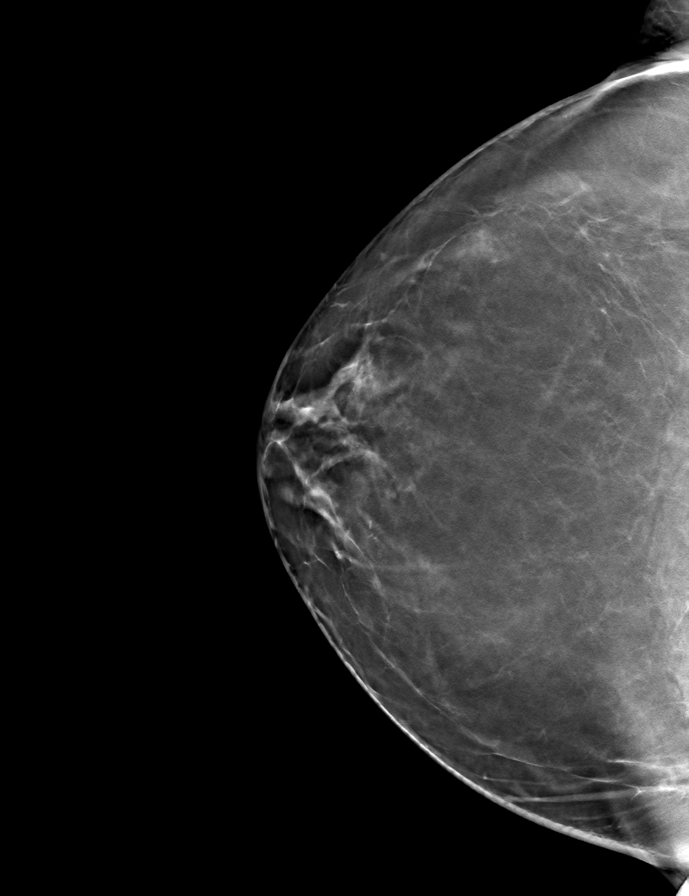

[L CC tomo · tomo slice 36/71.0]
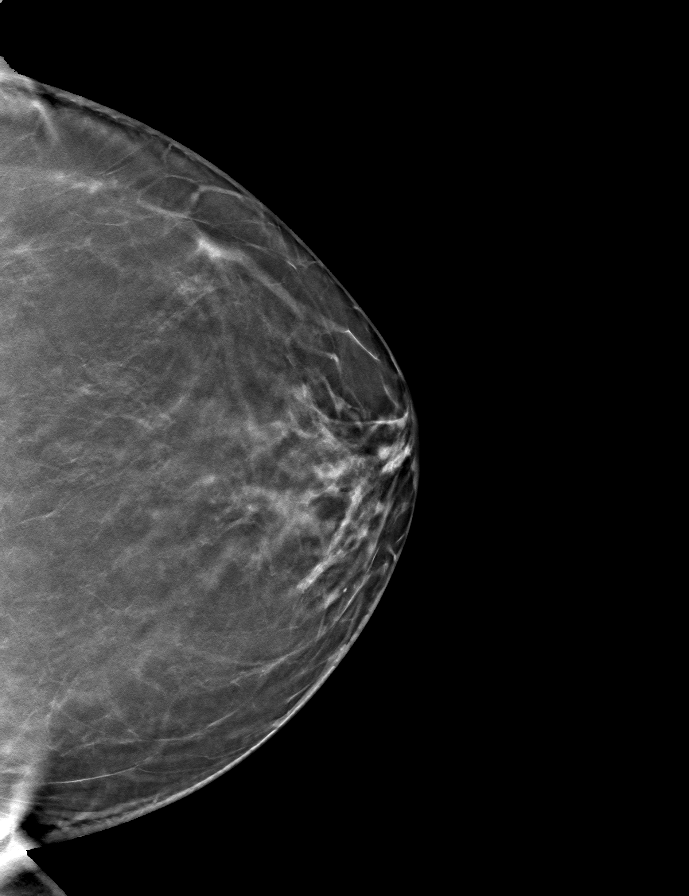

[L MLO tomo · tomo slice 39/77.0]
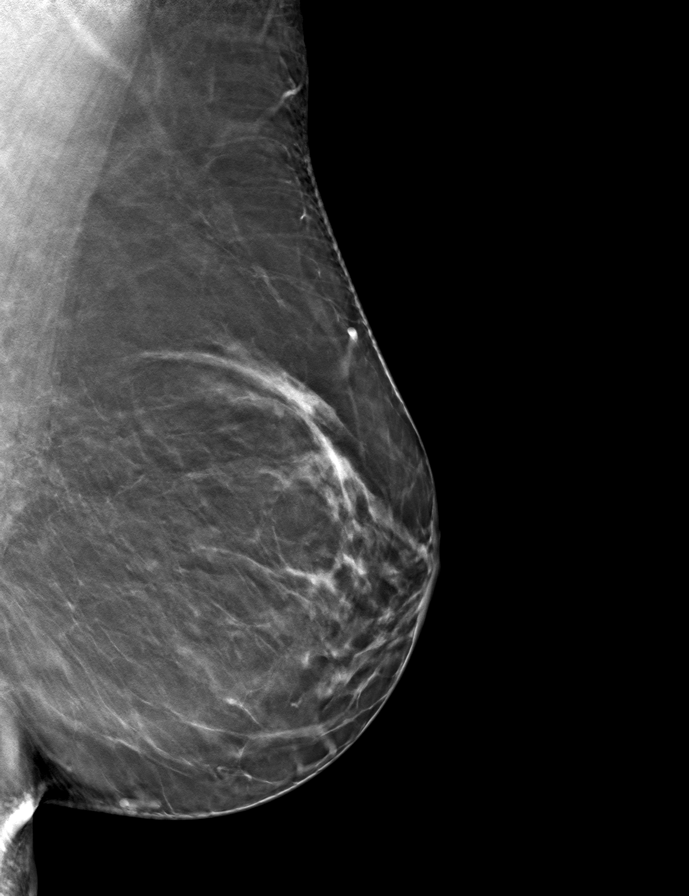

[9 of 24 positions shown; findings below may reference images not displayed]

ACR Breast Density Category b: There are scattered areas of
fibroglandular density.
FINDINGS: There are no findings suspicious for malignancy. Images were
processed with CAD.
IMPRESSION: No mammographic evidence of malignancy. A result letter of this
screening mammogram will be mailed directly to the patient.

RECOMMENDATION:
Screening mammogram in one year. (Code:[id number])

BI-RADS CATEGORY  1: Negative.

## 2021-07-05 ENCOUNTER — Other Ambulatory Visit: Payer: Self-pay | Admitting: Internal Medicine

## 2021-07-05 DIAGNOSIS — Z1231 Encounter for screening mammogram for malignant neoplasm of breast: Secondary | ICD-10-CM

## 2021-08-08 ENCOUNTER — Other Ambulatory Visit: Payer: Self-pay

## 2021-08-08 ENCOUNTER — Ambulatory Visit (INDEPENDENT_AMBULATORY_CARE_PROVIDER_SITE_OTHER): Payer: Medicare Other | Admitting: Ophthalmology

## 2021-08-08 ENCOUNTER — Encounter (INDEPENDENT_AMBULATORY_CARE_PROVIDER_SITE_OTHER): Payer: Self-pay | Admitting: Ophthalmology

## 2021-08-08 DIAGNOSIS — H33311 Horseshoe tear of retina without detachment, right eye: Secondary | ICD-10-CM | POA: Diagnosis not present

## 2021-08-08 DIAGNOSIS — H4311 Vitreous hemorrhage, right eye: Secondary | ICD-10-CM

## 2021-08-08 DIAGNOSIS — Z961 Presence of intraocular lens: Secondary | ICD-10-CM

## 2021-08-08 DIAGNOSIS — H33103 Unspecified retinoschisis, bilateral: Secondary | ICD-10-CM

## 2021-08-08 DIAGNOSIS — H43811 Vitreous degeneration, right eye: Secondary | ICD-10-CM

## 2021-08-08 DIAGNOSIS — H3581 Retinal edema: Secondary | ICD-10-CM

## 2021-08-08 MED ORDER — PREDNISOLONE ACETATE 1 % OP SUSP: 1.0000 [drp] | Freq: Four times a day (QID) | OPHTHALMIC | 0 refills | Status: AC

## 2021-08-08 NOTE — Progress Notes (Addendum)
[facility] Note  08/08/2021     CHIEF COMPLAINT Patient presents for Retina Evaluation   HISTORY OF PRESENT ILLNESS: Michelle Price is a 75 y.o. female who presents to the clinic today for:   HPI     Retina Evaluation   In right eye.  This started 1 day ago.  Duration of 1 day.  Associated Symptoms Flashes and Floaters.  I, the attending physician,  performed the HPI with the patient and updated documentation appropriately.        Comments   New pt referral from Dr. Talbert Forest for floaters and flashes OD. Pt states yesterday she started experiencing multiple floaters that appear to be 'veil like with spots' in OD. She did notice flashes last night while in bed in the dark. Doesn't report decrease in vision OD just the floaters. Pt does not have a history of floaters. Pt reports having annual eye exam around 2 months ago w/ Dr. Laurence Aly, good report. Does not wear rx specs, just OTC readers. Pt had catarac sx OU in 2019, Dr. Talbert Forest. Pt reports her son was born with a birth mark over his eye, had a blep sx, hx of ret detatchment.        Last edited by Bernarda Caffey, MD on 08/08/2021 11:15 AM.    Pt states last night she had a sudden onset of new floaters in her right eye, she states last night while she was in the dark she noticed fol temporally, she states she still has several floaters this morning as well, pt had cataract sx OU with Dr. Talbert Forest in 2019, her regular optometrist is Dr. Laurence Aly at [facility] [place], pt states she did not wear gl prior to cat sx  Referring physician: Darleen Crocker, MD [address] [place],  [place] [address]  HISTORICAL INFORMATION:   Selected notes from the MEDICAL RECORD NUMBER Dr. Talbert Forest pt here for fol / floaters OD LEE:  Ocular Hx- PMH-   CURRENT MEDICATIONS: Current Outpatient Medications (Ophthalmic Drugs)  Medication Sig   prednisoLONE acetate (PRED FORTE) 1 % ophthalmic suspension  Place 1 drop into the right eye 4 (four) times daily for 7 days.   No current facility-administered medications for this visit. (Ophthalmic Drugs)   Current Outpatient Medications (Other)  Medication Sig   zolpidem (AMBIEN) 5 MG tablet Take 1 tablet prn only for travel (Patient taking differently: Take 5 mg by mouth. Taking 1/2 tablet QHS for sleep PRN)   Cholecalciferol (VITAMIN D-3) 1000 units CAPS Take 1 capsule by mouth daily. (Patient not taking: Reported on 08/08/2021)   NONFORMULARY OR COMPOUNDED ITEM Triamcinolone 0.1% and Silvadene Cream 3:1 compound and use BID prn 4 oz. (Patient not taking: Reported on 08/08/2021)   No current facility-administered medications for this visit. (Other)   REVIEW OF SYSTEMS: ROS   Negative for: Constitutional, Gastrointestinal, Neurological, Skin, Genitourinary, Musculoskeletal, HENT, Endocrine, Cardiovascular, Eyes, Respiratory, Psychiatric, Allergic/Imm, Heme/Lymph Last edited by Kingsley Spittle, COT on 08/08/2021  9:18 AM.     ALLERGIES Allergies  Allergen Reactions   Codeine Nausea And Vomiting   Erythromycin Nausea Only   Nucynta [Tapentadol]    PAST MEDICAL HISTORY Past Medical History:  Diagnosis Date   Arthritis    Family history of anesthesia complication    mom hard to wake, vocal cord irritation   Medical history non-contributory    Past Surgical History:  Procedure Laterality Date  ABDOMINAL HYSTERECTOMY  02/01/1985   fibroids   BREAST CYST ASPIRATION Left 2013   CARPAL TUNNEL RELEASE  2001   right   CARPAL TUNNEL RELEASE Left 09/07/2013   Procedure: LEFT CARPAL TUNNEL RELEASE;  Surgeon: Wynonia Sours, MD;  Location: [facility];  Service: Orthopedics;  Laterality: Left;   CATARACT EXTRACTION     CHOLECYSTECTOMY  2004   lap choli   STERIOD INJECTION Left 09/07/2013   Procedure:  INJECTION LEFT CARPALMETACARPAL THUMB;  Surgeon: Wynonia Sours, MD;  Location: [facility];  Service: Orthopedics;   Laterality: Left;   WISDOM TOOTH EXTRACTION     FAMILY HISTORY Family History  Problem Relation Age of Onset   Diabetes Father    COPD Father    Diabetes Brother    Heart failure Paternal Grandmother    Diabetes Paternal Grandfather    Retinal detachment Son    SOCIAL HISTORY Social History   Tobacco Use   Smoking status: Never   Smokeless tobacco: Never  Substance Use Topics   Alcohol use: Yes    Alcohol/week: 4.0 standard drinks    Types: 4 Glasses of wine per week    Comment: occ wine   Drug use: No       OPHTHALMIC EXAM:  Base Eye Exam     Visual Acuity (Snellen - Linear)       Right Left   Dist [place] 20/20 -2 20/20 -1         Tonometry (Tonopen, 9:30 AM)       Right Left   Pressure 13 13         Dilation     Both eyes: 1.0% Mydriacyl, 2.5% Phenylephrine @ 9:31 AM           Slit Lamp and Fundus Exam     Slit Lamp Exam       Right Left   Lids/Lashes Dermatochalasis - upper lid, mild MGD Dermatochalasis - upper lid, mild MGD   Conjunctiva/Sclera White and quiet White and quiet, early pterygium nasally   Cornea 1+ fine Punctate epithelial erosions, mild tear film debris 1+ fine Punctate epithelial erosions, arcus, trace EBMD, early pterygium nasally   Anterior Chamber Deep and quiet Deep and quiet   Iris Round and dilated Round and dilated   Lens PC IOL in good position PC IOL in good position   Vitreous Vitreous syneresis, Posterior vitreous detachment, Weiss ring, blood stained vitreous condensations Vitreous syneresis         Fundus Exam       Right Left   Disc Pink and Sharp, mild tilt mild Pallor, Sharp rim, mild tilt, temporal PPA   C/D Ratio 0.4 0.5   Macula Flat, Good foveal reflex, mild RPE mottling, trace ERM, No heme or edema Flat, Good foveal reflex, mild RPE mottling, trace ERM, No heme or edema   Vessels mild attenuation mild attenuation   Periphery Attached, punctate IRH 0130 periphery, small retinal tear at 0700 equator  with surrounding heme - no SRF, linear pre-retinal heme settled inferior equator, shallow schisis IT and ST periphery, no RD on 360 scleral depression Attached; focal schisis with pigmented demarcation line at 0400           Refraction     Manifest Refraction       Sphere Cylinder Axis Dist VA   Right -0.75 +0.50 015 20/20   Left [place] +0.75 025 20/20  IMAGING AND PROCEDURES  Imaging and Procedures for 08/08/2021  OCT, Retina - OU - Both Eyes       Right Eye Quality was good. Central Foveal Thickness: 319. Progression has no prior data. Findings include normal foveal contour, no IRF, no SRF, myopic contour (Peripheral schisis IT periphery caught on widefield).   Left Eye Quality was good. Central Foveal Thickness: 316. Progression has no prior data. Findings include normal foveal contour, no IRF, no SRF, myopic contour (Partial PVD).   Notes *Images captured and stored on drive  Diagnosis / Impression:  NFP, no IRF/SRF OU centrally OD: peripheral retinoschisis caught on widefield  Clinical management:  See below  Abbreviations: NFP - Normal foveal profile. CME - cystoid macular edema. PED - pigment epithelial detachment. IRF - intraretinal fluid. SRF - subretinal fluid. EZ - ellipsoid zone. ERM - epiretinal membrane. ORA - outer retinal atrophy. ORT - outer retinal tubulation. SRHM - subretinal hyper-reflective material. IRHM - intraretinal hyper-reflective material      Repair Retinal Breaks, Laser - OD - Right Eye       LASER PROCEDURE NOTE  Procedure:  Barrier laser retinopexy using slit lamp laser, RIGHT eye   Diagnosis:   Retinal tear, RIGHT eye                     Small retinal tear at 0700 o'clock equator   Surgeon: Bernarda Caffey, MD, PhD  Anesthesia: Topical  Informed consent obtained, operative eye marked, and time out performed prior to initiation of laser.   Laser settings:  Lumenis Smart532 laser, slit lamp Lens: Mainster PRP  165 Power: 250 mW Spot size: 200 microns Duration: 30 msec  # spots: 219  Placement of laser: Using a Mainster PRP 165 contact lens at the slit lamp, laser was placed in three+ confluent rows around focal retinal tear at 7 oclock equator with additional rows anteriorly.  Complications: None.  Patient tolerated the procedure well and received written and verbal post-procedure care information/education.            ASSESSMENT/PLAN:    ICD-10-CM   1. Posterior vitreous detachment of right eye  H43.811     2. Vitreous hemorrhage of right eye ([place])  H43.11     3. Retinal tear of right eye  H33.311 Repair Retinal Breaks, Laser - OD - Right Eye    4. 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 Pseudophakia of both eyes  Z96.1     1,2. Hemorrhagic PVD OD  - Onset last night w/ symptomatic flashes/floaters OD -- called Dr. Talbert Forest' office this morning  - Discussed findings and prognosis  - small retinal tear at 0700 equator -- see below; +blood stained vit condensations centrally and preretinal heme settled inferiorly  - No RD on 360 scleral depressed exam  - Reviewed s/s of RT/RD  - Strict return precautions for any such RT/RD signs/symptoms  - VH precautions reviewed -- minimize activities, keep head elevated, avoid ASA/NSAIDs/blood thinners as able  - F/U 2 weeks, DFE, OCT  3. Retinal tear, OD.   - The incidence, risk factors, and natural history of retinal tear was discussed with patient.   - Potential treatment options including laser retinopexy and cryotherapy discussed with patient. - small tear at 0700 equator with surrounding heme, no SRF - recommend laser retinopexy OD today, 10.06.22 - RBA of procedure discussed, questions answered -  informed consent obtained and signed - see procedure note - start PF QID x7 days - f/u in 2 wks, DFE, OCT  4,5. Peripheral retinoschisis OU  - OD w/ peripheral schisis in ST and  IT quadrants -- no associated RT/RD  - OS w/ focal schisis at 0400 w/ pigmented demarcation line just posterior to schisis cavity -- no associated RT/RD  - monitor  6. Pseudophakia OU  - s/p CE/IOL OU (Dr. Talbert Forest, 2019)  - IOLs in perfect position, doing well  - monitor  Ophthalmic Meds Ordered this visit:  Meds ordered this encounter  Medications   prednisoLONE acetate (PRED FORTE) 1 % ophthalmic suspension    Sig: Place 1 drop into the right eye 4 (four) times daily for 7 days.    Dispense:  10 mL    Refill:  0     Return in about 2 weeks (around 08/22/2021) for f/u retinal tear / hemorrhagic PVD OD, DFE, OCT.  There are no Patient Instructions on file for this visit.   Explained the diagnoses, plan, and follow up with the patient and they expressed understanding.  Patient expressed understanding of the importance of proper follow up care.   This document serves as a record of services personally performed by Gardiner Sleeper, MD, PhD. It was created on their behalf by San Jetty. Owens Shark, OA an ophthalmic technician. The creation of this record is the provider's dictation and/or activities during the visit.    Electronically signed by: San Jetty. Owens Shark, [place] 10.06.2022 11:27 AM  Gardiner Sleeper, M.D., Ph.D. Diseases & Surgery of the Retina and Vitreous [facility]  I have reviewed the above documentation for accuracy and completeness, and I agree with the above. Gardiner Sleeper, M.D., Ph.D. 08/08/21 11:27 AM  Abbreviations: M myopia (nearsighted); A astigmatism; H hyperopia (farsighted); P presbyopia; Mrx spectacle prescription;  CTL contact lenses; OD right eye; OS left eye; OU both eyes  XT exotropia; ET esotropia; PEK punctate epithelial keratitis; PEE punctate epithelial erosions; DES dry eye syndrome; MGD meibomian gland dysfunction; ATs artificial tears; PFAT's preservative free artificial tears; [place] nuclear sclerotic cataract; PSC posterior subcapsular  cataract; ERM epi-retinal membrane; PVD posterior vitreous detachment; RD retinal detachment; DM diabetes mellitus; DR diabetic retinopathy; NPDR non-proliferative diabetic retinopathy; PDR proliferative diabetic retinopathy; CSME clinically significant macular edema; DME diabetic macular edema; dbh dot blot hemorrhages; CWS cotton wool spot; POAG primary open angle glaucoma; C/D cup-to-disc ratio; HVF humphrey visual field; GVF goldmann visual field; OCT optical coherence tomography; IOP intraocular pressure; BRVO Branch retinal vein occlusion; CRVO central retinal vein occlusion; CRAO central retinal artery occlusion; BRAO branch retinal artery occlusion; RT retinal tear; SB scleral buckle; PPV pars plana vitrectomy; VH Vitreous hemorrhage; PRP panretinal laser photocoagulation; IVK intravitreal kenalog; VMT vitreomacular traction; MH Macular hole;  NVD neovascularization of the disc; NVE neovascularization elsewhere; AREDS age related eye disease study; ARMD age related macular degeneration; POAG primary open angle glaucoma; EBMD epithelial/anterior basement membrane dystrophy; ACIOL anterior chamber intraocular lens; IOL intraocular lens; PCIOL posterior chamber intraocular lens; Phaco/IOL phacoemulsification with intraocular lens placement; Carrier photorefractive keratectomy; LASIK laser assisted in situ keratomileusis; HTN hypertension; DM diabetes mellitus; COPD chronic obstructive pulmonary disease

## 2021-08-19 ENCOUNTER — Other Ambulatory Visit: Payer: Self-pay

## 2021-08-19 ENCOUNTER — Ambulatory Visit
Admission: RE | Admit: 2021-08-19 | Discharge: 2021-08-19 | Disposition: A | Discharge status: Home / Self Care | Payer: Medicare Other | Source: Ambulatory Visit

## 2021-08-19 DIAGNOSIS — Z1231 Encounter for screening mammogram for malignant neoplasm of breast: Secondary | ICD-10-CM

## 2021-08-19 NOTE — Progress Notes (Signed)
[facility] - Clinic Note  08/23/2021     CHIEF COMPLAINT Patient presents for Retina Follow Up   HISTORY OF PRESENT ILLNESS: Michelle Price is a 75 y.o. female who presents to the clinic today for:   HPI     Retina Follow Up   Patient presents with  PVD.  In right eye.  This started weeks ago.  Severity is moderate.  Duration of weeks.  Since onset it is stable.  I, the attending physician,  performed the HPI with the patient and updated documentation appropriately.        Comments   Pt states her vision is stable OU.  Pt states floaters OU are better.  Pt has occasional flashes of light while in the dark.      Last edited by Rennis Chris, MD on 08/23/2021 10:38 PM.    Still has some floaters and cloudiness OD, but they are gradually improving.  Referring physician: Ander Purpura, OD [address] [place],  [place] [address]  HISTORICAL INFORMATION:   Selected notes from the MEDICAL RECORD NUMBER Dr. Vonna Kotyk pt here for fol / floaters OD   CURRENT MEDICATIONS: No current outpatient medications on file. (Ophthalmic Drugs)   No current facility-administered medications for this visit. (Ophthalmic Drugs)   Current Outpatient Medications (Other)  Medication Sig   Cholecalciferol (VITAMIN D-3) 1000 units CAPS Take 1 capsule by mouth daily. (Patient not taking: Reported on 08/08/2021)   NONFORMULARY OR COMPOUNDED ITEM Triamcinolone 0.1% and Silvadene Cream 3:1 compound and use BID prn 4 oz. (Patient not taking: Reported on 08/08/2021)   zolpidem (AMBIEN) 5 MG tablet Take 1 tablet prn only for travel (Patient taking differently: Take 5 mg by mouth. Taking 1/2 tablet QHS for sleep PRN)   No current facility-administered medications for this visit. (Other)   REVIEW OF SYSTEMS: ROS   Positive for: Eyes Negative for: Constitutional, Gastrointestinal, Neurological, Skin, Genitourinary, Musculoskeletal, HENT, Endocrine, Cardiovascular,  Respiratory, Psychiatric, Allergic/Imm, Heme/Lymph Last edited by Corrinne Eagle on 08/23/2021  8:10 AM.      ALLERGIES Allergies  Allergen Reactions   Codeine Nausea And Vomiting   Erythromycin Nausea Only   Nucynta [Tapentadol]    PAST MEDICAL HISTORY Past Medical History:  Diagnosis Date   Arthritis    Family history of anesthesia complication    mom hard to wake, vocal cord irritation   Medical history non-contributory    Past Surgical History:  Procedure Laterality Date   ABDOMINAL HYSTERECTOMY  02/01/1985   fibroids   BREAST CYST ASPIRATION Left 2013   CARPAL TUNNEL RELEASE  2001   right   CARPAL TUNNEL RELEASE Left 09/07/2013   Procedure: LEFT CARPAL TUNNEL RELEASE;  Surgeon: Nicki Reaper, MD;  Location: [facility];  Service: Orthopedics;  Laterality: Left;   CATARACT EXTRACTION     CHOLECYSTECTOMY  2004   lap choli   STERIOD INJECTION Left 09/07/2013   Procedure:  INJECTION LEFT CARPALMETACARPAL THUMB;  Surgeon: Nicki Reaper, MD;  Location: [facility];  Service: Orthopedics;  Laterality: Left;   WISDOM TOOTH EXTRACTION     FAMILY HISTORY Family History  Problem Relation Age of Onset   Diabetes Father    COPD Father    Heart failure Paternal Grandmother    Diabetes Paternal Grandfather    Diabetes Brother    Retinal detachment Son    Breast cancer Neg Hx    SOCIAL HISTORY Social History  Tobacco Use   Smoking status: Never   Smokeless tobacco: Never  Substance Use Topics   Alcohol use: Yes    Alcohol/week: 4.0 standard drinks    Types: 4 Glasses of wine per week    Comment: occ wine   Drug use: No       OPHTHALMIC EXAM:  Base Eye Exam     Visual Acuity (Snellen - Linear)       Right Left   Dist [place] 20/20 -2 20/20 -2         Tonometry (Tonopen, 8:14 AM)       Right Left   Pressure 17 15         Pupils       Dark Light Shape React APD   Right 2 1 Round Minimal 0   Left 2 1 Round Minimal 0          Visual Fields       Left Right    Full Full         Extraocular Movement       Right Left    Full Full         Neuro/Psych     Oriented x3: Yes   Mood/Affect: Normal         Dilation     Both eyes: 1.0% Mydriacyl, 2.5% Phenylephrine @ 8:14 AM           Slit Lamp and Fundus Exam     Slit Lamp Exam       Right Left   Lids/Lashes Dermatochalasis - upper lid, mild MGD Dermatochalasis - upper lid, mild MGD   Conjunctiva/Sclera White and quiet White and quiet, early pterygium nasally   Cornea 1+ fine Punctate epithelial erosions, mild tear film debris 1+ fine Punctate epithelial erosions, arcus, trace EBMD, early pterygium nasally   Anterior Chamber Deep and quiet Deep and quiet   Iris Round and dilated Round and dilated   Lens PC IOL in good position PC IOL in good position   Vitreous Vitreous syneresis, Posterior vitreous detachment, Weiss ring, blood stained vitreous condensations--improved/essentially resolved Vitreous syneresis         Fundus Exam       Right Left   Disc Pink and Sharp, mild tilt mild Pallor, Sharp rim, mild tilt, temporal PPA   C/D Ratio 0.4 0.5   Macula Flat, Good foveal reflex, mild RPE mottling, trace ERM, No heme or edema Flat, Good foveal reflex, mild RPE mottling, trace ERM, No heme or edema   Vessels mild attenuation mild attenuation   Periphery Attached, punctate IRH 0130 periphery--resolved, small retinal tear at 0700 equator with surrounding heme - now operculated with good laser surrounding, linear pre-retinal heme inferior equator--resolved; shallow schisis IT and ST periphery, no RT/RD Attached; focal schisis with pigmented demarcation line at 0400           IMAGING AND PROCEDURES  Imaging and Procedures for 08/23/2021  OCT, Retina - OU - Both Eyes       Right Eye Quality was good. Central Foveal Thickness: 319. Progression has improved. Findings include normal foveal contour, no IRF, no SRF, myopic contour  (Interval improvement in vitreous opacities. Peripheral schisis IT periphery caught on widefield--not imaged today).   Left Eye Quality was good. Central Foveal Thickness: 318. Progression has been stable. Findings include normal foveal contour, no IRF, no SRF, myopic contour (Partial PVD).   Notes *Images captured and stored on drive  Diagnosis / Impression:  NFP, no IRF/SRF OU centrally OD: Interval improvement in vitreous opacities.  Peripheral schisis IT periphery caught on widefield--not imaged today  Clinical management:  See below  Abbreviations: NFP - Normal foveal profile. CME - cystoid macular edema. PED - pigment epithelial detachment. IRF - intraretinal fluid. SRF - subretinal fluid. EZ - ellipsoid zone. ERM - epiretinal membrane. ORA - outer retinal atrophy. ORT - outer retinal tubulation. SRHM - subretinal hyper-reflective material. IRHM - intraretinal hyper-reflective material             ASSESSMENT/PLAN:    ICD-10-CM   1. Posterior vitreous detachment of right eye  H43.811     2. Vitreous hemorrhage of right eye (HCC)  H43.11     3. Retinal tear of right eye  H33.311     4. 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 Pseudophakia of both eyes  Z96.1      1,2. Hemorrhagic PVD OD  - Onset 10.5.22 w/ symptomatic flashes/floaters OD -- called Dr. Vonna Kotyk' office 10.6.22  - Discussed findings and prognosis  - small retinal tear at 0700 equator -- see below; +blood stained vit condensations centrally and preretinal heme settled inferiorly -- significantly improved today  - No RD on 360 scleral depressed exam  - Reviewed s/s of RT/RD  - Strict return precautions for any such RT/RD signs/symptoms  - F/U 2-3 mos, sooner prn w/DFE, OCT  3. Retinal tear, OD.   - small tear at 0700 equator with surrounding heme, no SRF - ls/p aser retinopexy OD 10.06.22 -- good laser in place - tear now operculated - f/u in  2-3 mos, DFE, OCT  4,5. Peripheral retinoschisis OU  - OD w/ peripheral schisis in ST and IT quadrants -- no associated RT/RD  - OS w/ focal schisis at 0400 w/ pigmented demarcation line just posterior to schisis cavity -- no associated RT/RD  - monitor   6. Pseudophakia OU  - s/p CE/IOL OU (Dr. Vonna Kotyk, 2019)  - IOLs in perfect position, doing well  - monitor   Ophthalmic Meds Ordered this visit:  No orders of the defined types were placed in this encounter.    Return for 2-3 mo f/u w/DFE/OCT.  There are no Patient Instructions on file for this visit.  Explained the diagnoses, plan, and follow up with the patient and they expressed understanding.  Patient expressed understanding of the importance of proper follow up care.   This document serves as a record of services personally performed by Karie Chimera, MD, PhD. It was created on their behalf by Joni Reining, an ophthalmic technician. The creation of this record is the provider's dictation and/or activities during the visit.    Electronically signed by: Joni Reining COA, 08/23/21  10:39 PM  Karie Chimera, M.D., Ph.D. Diseases & Surgery of the Retina and Vitreous [facility] 08/23/2021  I have reviewed the above documentation for accuracy and completeness, and I agree with the above. Karie Chimera, M.D., Ph.D. 08/23/21 10:41 PM   Abbreviations: M myopia (nearsighted); A astigmatism; H hyperopia (farsighted); P presbyopia; Mrx spectacle prescription;  CTL contact lenses; OD right eye; OS left eye; OU both eyes  XT exotropia; ET esotropia; PEK punctate epithelial keratitis; PEE punctate epithelial erosions; DES dry eye syndrome; MGD meibomian gland dysfunction; ATs artificial tears; PFAT's preservative free artificial tears; NSC nuclear sclerotic cataract; PSC posterior subcapsular cataract;  ERM epi-retinal membrane; PVD posterior vitreous detachment; RD retinal detachment; DM diabetes mellitus; DR  diabetic retinopathy; NPDR non-proliferative diabetic retinopathy; PDR proliferative diabetic retinopathy; CSME clinically significant macular edema; DME diabetic macular edema; dbh dot blot hemorrhages; CWS cotton wool spot; POAG primary open angle glaucoma; C/D cup-to-disc ratio; HVF humphrey visual field; GVF goldmann visual field; OCT optical coherence tomography; IOP intraocular pressure; BRVO Branch retinal vein occlusion; CRVO central retinal vein occlusion; CRAO central retinal artery occlusion; BRAO branch retinal artery occlusion; RT retinal tear; SB scleral buckle; PPV pars plana vitrectomy; VH Vitreous hemorrhage; PRP panretinal laser photocoagulation; IVK intravitreal kenalog; VMT vitreomacular traction; MH Macular hole;  NVD neovascularization of the disc; NVE neovascularization elsewhere; AREDS age related eye disease study; ARMD age related macular degeneration; POAG primary open angle glaucoma; EBMD epithelial/anterior basement membrane dystrophy; ACIOL anterior chamber intraocular lens; IOL intraocular lens; PCIOL posterior chamber intraocular lens; Phaco/IOL phacoemulsification with intraocular lens placement; PRK photorefractive keratectomy; LASIK laser assisted in situ keratomileusis; HTN hypertension; DM diabetes mellitus; COPD chronic obstructive pulmonary disease

## 2021-08-23 ENCOUNTER — Encounter (INDEPENDENT_AMBULATORY_CARE_PROVIDER_SITE_OTHER): Payer: Self-pay | Admitting: Ophthalmology

## 2021-08-23 ENCOUNTER — Ambulatory Visit (INDEPENDENT_AMBULATORY_CARE_PROVIDER_SITE_OTHER): Payer: Medicare Other | Admitting: Ophthalmology

## 2021-08-23 ENCOUNTER — Other Ambulatory Visit: Payer: Self-pay

## 2021-08-23 DIAGNOSIS — H4311 Vitreous hemorrhage, right eye: Secondary | ICD-10-CM

## 2021-08-23 DIAGNOSIS — H33311 Horseshoe tear of retina without detachment, right eye: Secondary | ICD-10-CM | POA: Diagnosis not present

## 2021-08-23 DIAGNOSIS — Z961 Presence of intraocular lens: Secondary | ICD-10-CM

## 2021-08-23 DIAGNOSIS — H43811 Vitreous degeneration, right eye: Secondary | ICD-10-CM

## 2021-08-23 DIAGNOSIS — H33103 Unspecified retinoschisis, bilateral: Secondary | ICD-10-CM | POA: Diagnosis not present

## 2021-08-23 DIAGNOSIS — H3581 Retinal edema: Secondary | ICD-10-CM

## 2021-11-08 ENCOUNTER — Encounter (INDEPENDENT_AMBULATORY_CARE_PROVIDER_SITE_OTHER): Payer: Medicare Other | Admitting: Ophthalmology

## 2021-11-11 ENCOUNTER — Encounter (INDEPENDENT_AMBULATORY_CARE_PROVIDER_SITE_OTHER): Payer: Medicare Other | Admitting: Ophthalmology

## 2021-11-11 NOTE — Progress Notes (Addendum)
°[facility] - Clinic Note ° °11/13/2021 ° °  ° °CHIEF COMPLAINT °Patient presents for Retina Follow Up ° ° ° °HISTORY OF PRESENT ILLNESS: °Michelle Price is a 76 y.o. female who presents to the clinic today for:  ° °HPI   ° ° Retina Follow Up   °Patient presents with  Retinal Break/Detachment.  In right eye.  Severity is moderate.  Duration of 3.5 months.  Since onset it is stable.  I, the attending physician,  performed the HPI with the patient and updated documentation appropriately. ° °  °  ° ° Comments   °Pt here for 3.5 mo ret f/u for PVD OD. Pt states vision is okay, no new floaters or FOL. No changes shes noticed.  ° °  °  °Last edited by Zamora, Brian, MD on 11/13/2021 12:38 PM.  °  ° °Pt states vision is stable, no new floaters or fol ° °Referring physician: °Bevis, Timothy, MD °[address] °[address] °[place],   [id number] ° °HISTORICAL INFORMATION:  ° °Selected notes from the medical record:  °Dr. Bevis pt here for fol / floaters OD  ° °CURRENT MEDICATIONS: °No current outpatient medications on file. (Ophthalmic Drugs)  ° °No current facility-administered medications for this visit. (Ophthalmic Drugs)  ° °Current Outpatient Medications (Other)  °Medication Sig  ° Cholecalciferol (VITAMIN D-3) 1000 units CAPS Take 1 capsule by mouth daily. (Patient not taking: Reported on 08/08/2021)  ° NONFORMULARY OR COMPOUNDED ITEM Triamcinolone 0.1% and Silvadene Cream 3:1 compound and use BID prn 4 oz. (Patient not taking: Reported on 08/08/2021)  ° zolpidem (AMBIEN) 5 MG tablet Take 1 tablet prn only for travel (Patient taking differently: Take 5 mg by mouth. Taking 1/2 tablet QHS for sleep PRN)  ° °No current facility-administered medications for this visit. (Other)  ° °REVIEW OF SYSTEMS: °ROS   °Positive for: Eyes °Negative for: Constitutional, Gastrointestinal, Neurological, Skin, Genitourinary, Musculoskeletal, HENT, Endocrine, Cardiovascular, Respiratory, Psychiatric,  Allergic/Imm, Heme/Lymph °Last edited by Simpson, Makenzie E, COT on 11/13/2021  8:47 AM.  °  ° °ALLERGIES °Allergies  °Allergen Reactions  ° Codeine Nausea And Vomiting  ° Erythromycin Nausea Only  ° Nucynta [Tapentadol]   ° °PAST MEDICAL HISTORY °Past Medical History:  °Diagnosis Date  ° Arthritis   ° Family history of anesthesia complication   ° mom hard to wake, vocal cord irritation  ° Medical history non-contributory   ° °Past Surgical History:  °Procedure Laterality Date  ° ABDOMINAL HYSTERECTOMY  02/01/1985  ° fibroids  ° BREAST CYST ASPIRATION Left 2013  ° CARPAL TUNNEL RELEASE  2001  ° right  ° CARPAL TUNNEL RELEASE Left 09/07/2013  ° Procedure: LEFT CARPAL TUNNEL RELEASE;  Surgeon: Gary R Kuzma, MD;  Location: [facility];  Service: Orthopedics;  Laterality: Left;  ° CATARACT EXTRACTION    ° CHOLECYSTECTOMY  2004  ° lap choli  ° STERIOD INJECTION Left 09/07/2013  ° Procedure:  INJECTION LEFT CARPALMETACARPAL THUMB;  Surgeon: Gary R Kuzma, MD;  Location: [facility];  Service: Orthopedics;  Laterality: Left;  ° WISDOM TOOTH EXTRACTION    ° °FAMILY HISTORY °Family History  °Problem Relation Age of Onset  ° Diabetes Father   ° COPD Father   ° Heart failure Paternal Grandmother   ° Diabetes Paternal Grandfather   ° Diabetes Brother   ° Retinal detachment Son   ° Breast cancer Neg Hx   ° °SOCIAL HISTORY °Social History  ° °Tobacco   Use  ° Smoking status: Never  ° Smokeless tobacco: Never  °Vaping Use  ° Vaping Use: Never used  °Substance Use Topics  ° Alcohol use: Yes  °  Alcohol/week: 4.0 standard drinks  °  Types: 4 Glasses of wine per week  °  Comment: occ wine  ° Drug use: No  °  ° °  °OPHTHALMIC EXAM: ° °Base Eye Exam   ° ° Visual Acuity (Snellen - Linear)   ° °   Right Left  ° Dist [employer] 20/20 -1 20/20 -2  ° °  °  ° ° Tonometry (Tonopen, 8:51 AM)   ° °   Right Left  ° Pressure 15 16  ° °  °  ° ° Pupils   ° °   Dark Light Shape React APD  ° Right 2 1 Round Minimal None  ° Left 2 1  Round Minimal None  ° °  °  ° ° Visual Fields (Counting fingers)   ° °   Left Right  °  Full Full  ° °  °  ° ° Extraocular Movement   ° °   Right Left  °  Full, Ortho Full, Ortho  ° °  °  ° ° Neuro/Psych   ° ° Oriented x3: Yes  ° Mood/Affect: Normal  ° °  °  ° ° Dilation   ° ° Both eyes: 1.0% Mydriacyl, 2.5% Phenylephrine @ 8:53 AM  ° °  °  ° °  ° °Slit Lamp and Fundus Exam   ° ° Slit Lamp Exam   ° °   Right Left  ° Lids/Lashes Dermatochalasis - upper lid, mild MGD Dermatochalasis - upper lid, mild MGD  ° Conjunctiva/Sclera White and quiet White and quiet, early pterygium nasally  ° Cornea 1-2+ fine Punctate epithelial erosions, mild tear film debris 1+ fine Punctate epithelial erosions, arcus, trace EBMD, early pterygium nasally  ° Anterior Chamber Deep and quiet Deep and quiet  ° Iris Round and dilated Round and dilated  ° Lens PC IOL in good position PC IOL in good position  ° Anterior Vitreous Vitreous syneresis, Posterior vitreous detachment, Weiss ring, interval improvement in vitreous condensations Vitreous syneresis  ° °  °  ° ° Fundus Exam   ° °   Right Left  ° Disc Pink and Sharp, mild tilt mild Pallor, Sharp rim, mild tilt, temporal PPA  ° C/D Ratio 0.4 0.5  ° Macula Flat, Good foveal reflex, mild RPE mottling, trace ERM, No heme or edema Flat, Good foveal reflex, mild RPE mottling, trace ERM, No heme or edema  ° Vessels mild attenuation mild attenuation  ° Periphery Attached, punctate IRH 0130 periphery--resolved, small retinal tear at 0700 equator with surrounding heme - now operculated with good laser surrounding, linear pre-retinal heme inferior equator--resolved; shallow schisis IT and ST periphery, new HST at 0430 periphery Attached; focal schisis with pigmented demarcation line at 0400  ° °  °  ° °  ° °IMAGING AND PROCEDURES  °Imaging and Procedures for 11/13/2021 ° °OCT, Retina - OU - Both Eyes   ° °   °Right Eye °Quality was good. Central Foveal Thickness: 320. Progression has improved. Findings  include normal foveal contour, no IRF, no SRF, myopic contour (Interval improvement in vitreous opacities. Peripheral schisis IT periphery caught on widefield).  ° °Left Eye °Quality was good. Central Foveal Thickness: 319. Progression has been stable. Findings include normal foveal contour, no IRF, no SRF, myopic contour (Partial PVD).  ° °  Notes °*Images captured and stored on drive ° °Diagnosis / Impression:  °NFP, no IRF/SRF OU centrally °OD: Interval improvement in vitreous opacities.  Peripheral schisis IT periphery caught on widefield ° °Clinical management:  °See below ° °Abbreviations: NFP - Normal foveal profile. CME - cystoid macular edema. PED - pigment epithelial detachment. IRF - intraretinal fluid. SRF - subretinal fluid. EZ - ellipsoid zone. ERM - epiretinal membrane. ORA - outer retinal atrophy. ORT - outer retinal tubulation. SRHM - subretinal hyper-reflective material. IRHM - intraretinal hyper-reflective material ° ° °  ° °Repair Retinal Breaks, Laser - OD - Right Eye   ° °   °LASER PROCEDURE NOTE ° °Procedure:  Barrier laser retinopexy using slit lamp laser, RIGHT eye  ° °Diagnosis:   Retinal tear, RIGHT eye °                    New horseshoe retinal tear at 0430 o'clock almost to ora  ° °Surgeon: Brian Zamora, MD, PhD ° °Anesthesia: Topical ° °Informed consent obtained, operative eye marked, and time out performed prior to initiation of laser.  ° °Laser settings:  °Lumenis Smart532 laser, slit lamp °Lens: [place] PRP 165 °Power: 260 mW °Spot size: 200 microns °Duration: 30 msec  °# spots: 178 ° °Placement of laser: Using a [place] PRP 165 contact lens at the slit lamp, laser was placed in three+ confluent rows posterior to and around horseshoe tear at 0430. The anterior portion of retinopexy was completed via laser indirect ophthalmoscopy w/ scleral depression: 339 spots, 270 mW power, 70 ms duration. ° °Complications: None. ° °Patient tolerated the procedure well and received written and  verbal post-procedure care information/education. ° ° °  °  °  ° °  °ASSESSMENT/PLAN: ° °  ICD-10-CM   °1. Posterior vitreous detachment of right eye  H43.811 OCT, Retina - OU - Both Eyes  °  °2. Vitreous hemorrhage of right eye (HCC)  H43.11   °  °3. Retinal tear of right eye  H33.311 Repair Retinal Breaks, Laser - OD - Right Eye  °  °4. 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 Pseudophakia of both eyes  Z96.1   °  ° ° °1,2. Hemorrhagic PVD OD ° - Onset 10.5.22 w/ symptomatic flashes/floaters OD -- called Dr. Bevis' office 10.6.22 ° - Discussed findings and prognosis ° - small retinal tear at 0700 equator -- see below; +blood stained vit condensations centrally and preretinal heme settled inferiorly -- significantly improved today ° - BCVA 20/20 OD ° - No RD on 360 scleral depressed exam, but new HST identified at 0430 -- see below ° - Reviewed s/s of RT/RD ° - Strict return precautions for any such RT/RD signs/symptoms ° - F/U 2-3 weeks, DFE, OCT ° °2. Retinal tear, OD °- small tear at 0700 equator with surrounding heme, no SRF °- s/p laser retinopexy OD 10.06.22 -- good laser in place °- today, new HST found at 0430 periphery OD ° - recommend laser retinopexy OD today, 01.11.23 ° - pt wishes to proceed with laser ° - RBA of procedure discussed, questions answered °- informed consent obtained and signed °- see procedure note °- start PF QID OD x7 days °- f/u in 2-3 weeks, DFE, OCT ° °3,4. Peripheral retinoschisis OU ° - OD w/ peripheral schisis in ST and IT quadrants -- no associated RT/RD ° - OS w/ focal schisis at 0400 w/ pigmented demarcation line just posterior to schisis cavity -- no associated RT/RD ° -   monitor  ° °5. Pseudophakia OU ° - s/p CE/IOL OU (Dr. Bevis, 2019) ° - IOLs in perfect position, doing well ° - monitor  ° °Ophthalmic Meds Ordered this visit:  °No orders of the defined types were placed in this encounter. ° °  ° °Return for f/u 2-3 weels. retinal tear OD, DFE, OCT. ° °There are no Patient  Instructions on file for this visit. ° °Explained the diagnoses, plan, and follow up with the patient and they expressed understanding.  Patient expressed understanding of the importance of proper follow up care.  ° °This document serves as a record of services personally performed by Brian G. Zamora, MD, PhD. It was created on their behalf by Makenzie Simpson, an ophthalmic technician. The creation of this record is the provider's dictation and/or activities during the visit.   ° °Electronically signed by: Makenzie Simpson, OA, 11/13/21  12:52 PM ° °This document serves as a record of services personally performed by Brian G. Zamora, MD, PhD. It was created on their behalf by Amanda J. Brown, OA an ophthalmic technician. The creation of this record is the provider's dictation and/or activities during the visit.   ° °Electronically signed by: Amanda J. Brown, OA 01.11.2023 12:52 PM ° °Brian G. Zamora, M.D., Ph.D. °Diseases & Surgery of the Retina and Vitreous °[facility] ° °I have reviewed the above documentation for accuracy and completeness, and I agree with the above. Brian G. Zamora, M.D., Ph.D. 11/13/21 12:52 PM ° ° °Abbreviations: °M myopia (nearsighted); A astigmatism; H hyperopia (farsighted); P presbyopia; Mrx spectacle prescription;  CTL contact lenses; OD right eye; OS left eye; OU both eyes  XT exotropia; ET esotropia; PEK punctate epithelial keratitis; PEE punctate epithelial erosions; DES dry eye syndrome; MGD meibomian gland dysfunction; ATs artificial tears; PFAT's preservative free artificial tears; NSC nuclear sclerotic cataract; PSC posterior subcapsular cataract; ERM epi-retinal membrane; PVD posterior vitreous detachment; RD retinal detachment; DM diabetes mellitus; DR diabetic retinopathy; NPDR non-proliferative diabetic retinopathy; PDR proliferative diabetic retinopathy; CSME clinically significant macular edema; DME diabetic macular edema; dbh dot blot hemorrhages;  CWS cotton wool spot; POAG primary open angle glaucoma; C/D cup-to-disc ratio; HVF humphrey visual field; GVF goldmann visual field; OCT optical coherence tomography; IOP intraocular pressure; BRVO Branch retinal vein occlusion; CRVO central retinal vein occlusion; CRAO central retinal artery occlusion; BRAO branch retinal artery occlusion; RT retinal tear; SB scleral buckle; PPV pars plana vitrectomy; VH Vitreous hemorrhage; PRP panretinal laser photocoagulation; IVK intravitreal kenalog; VMT vitreomacular traction; MH Macular hole;  NVD neovascularization of the disc; NVE neovascularization elsewhere; AREDS age related eye disease study; ARMD age related macular degeneration; POAG primary open angle glaucoma; EBMD epithelial/anterior basement membrane dystrophy; ACIOL anterior chamber intraocular lens; IOL intraocular lens; PCIOL posterior chamber intraocular lens; Phaco/IOL phacoemulsification with intraocular lens placement; PRK photorefractive keratectomy; LASIK laser assisted in situ keratomileusis; HTN hypertension; DM diabetes mellitus; COPD chronic obstructive pulmonary disease ° °

## 2021-11-13 ENCOUNTER — Encounter (INDEPENDENT_AMBULATORY_CARE_PROVIDER_SITE_OTHER): Payer: Self-pay | Admitting: Ophthalmology

## 2021-11-13 ENCOUNTER — Ambulatory Visit (INDEPENDENT_AMBULATORY_CARE_PROVIDER_SITE_OTHER): Payer: Medicare Other | Admitting: Ophthalmology

## 2021-11-13 ENCOUNTER — Other Ambulatory Visit: Payer: Self-pay

## 2021-11-13 DIAGNOSIS — H43811 Vitreous degeneration, right eye: Secondary | ICD-10-CM

## 2021-11-13 DIAGNOSIS — H33311 Horseshoe tear of retina without detachment, right eye: Secondary | ICD-10-CM

## 2021-11-13 DIAGNOSIS — H33103 Unspecified retinoschisis, bilateral: Secondary | ICD-10-CM | POA: Diagnosis not present

## 2021-11-13 DIAGNOSIS — Z961 Presence of intraocular lens: Secondary | ICD-10-CM

## 2021-11-13 DIAGNOSIS — H4311 Vitreous hemorrhage, right eye: Secondary | ICD-10-CM | POA: Diagnosis not present

## 2021-11-25 NOTE — Progress Notes (Signed)
[facility] Note  11/27/2021     CHIEF COMPLAINT Patient presents for Retina Follow Up    HISTORY OF PRESENT ILLNESS: Michelle Price is a 76 y.o. female who presents to the clinic today for:   HPI     Retina Follow Up   Patient presents with  PVD.  In right eye.  This started months ago.  Severity is mild.  Duration of 2 weeks.  Since onset it is stable.  I, the attending physician,  performed the HPI with the patient and updated documentation appropriately.        Comments   75 y/o female pt here for 2 wk f/u for hemorrhagic PVD/ret tear OD.  No change in [place] OU.  Denies pain, FOL, floaters.  No gtts.      Last edited by Bernarda Caffey, MD on 11/27/2021  9:21 AM.      Referring physician: Crist Infante, MD [place],  [place] [address]  HISTORICAL INFORMATION:   Selected notes from the MEDICAL RECORD NUMBER Dr. Talbert Forest pt here for fol / floaters OD   CURRENT MEDICATIONS: No current outpatient medications on file. (Ophthalmic Drugs)   No current facility-administered medications for this visit. (Ophthalmic Drugs)   Current Outpatient Medications (Other)  Medication Sig   Ascorbic Acid (VITAMIN C) 500 MG CAPS See admin instructions.   Cholecalciferol (VITAMIN D-3) 1000 units CAPS Take 1 capsule by mouth daily.   Coenzyme Q10 (CO Q 10) 100 MG CAPS 1x a day   Cyanocobalamin (B-12) 100 MCG TABS 1x a day   hydrocortisone 2.5 % cream hydrocortisone 2.5 % topical cream   NONFORMULARY OR COMPOUNDED ITEM Triamcinolone 0.1% and Silvadene Cream 3:1 compound and use BID prn 4 oz.   zolpidem (AMBIEN) 5 MG tablet Take 1 tablet prn only for travel (Patient taking differently: Take 5 mg by mouth. Taking 1/2 tablet QHS for sleep PRN)   No current facility-administered medications for this visit. (Other)   REVIEW OF SYSTEMS: ROS   Positive for: Musculoskeletal, Eyes Negative for: Constitutional, Gastrointestinal, Neurological, Skin,  Genitourinary, HENT, Endocrine, Cardiovascular, Respiratory, Psychiatric, Allergic/Imm, Heme/Lymph Last edited by Matthew Folks, COA on 11/27/2021  8:43 AM.      ALLERGIES Allergies  Allergen Reactions   Codeine Nausea And Vomiting   Erythromycin Nausea Only   Nucynta [Tapentadol]    PAST MEDICAL HISTORY Past Medical History:  Diagnosis Date   Arthritis    Family history of anesthesia complication    mom hard to wake, vocal cord irritation   Medical history non-contributory    Past Surgical History:  Procedure Laterality Date   ABDOMINAL HYSTERECTOMY  02/01/1985   fibroids   BREAST CYST ASPIRATION Left 2013   CARPAL TUNNEL RELEASE  2001   right   CARPAL TUNNEL RELEASE Left 09/07/2013   Procedure: LEFT CARPAL TUNNEL RELEASE;  Surgeon: Wynonia Sours, MD;  Location: [facility];  Service: Orthopedics;  Laterality: Left;   CATARACT EXTRACTION     CHOLECYSTECTOMY  2004   lap choli   STERIOD INJECTION Left 09/07/2013   Procedure:  INJECTION LEFT CARPALMETACARPAL THUMB;  Surgeon: Wynonia Sours, MD;  Location: [facility];  Service: Orthopedics;  Laterality: Left;   WISDOM TOOTH EXTRACTION     FAMILY HISTORY Family History  Problem Relation Age of Onset   Diabetes Father    COPD Father    Heart failure Paternal Grandmother    Diabetes  Paternal Grandfather    Diabetes Brother    Retinal detachment Son    Breast cancer Neg Hx    SOCIAL HISTORY Social History   Tobacco Use   Smoking status: Never   Smokeless tobacco: Never  Vaping Use   Vaping Use: Never used  Substance Use Topics   Alcohol use: Yes    Alcohol/week: 4.0 standard drinks    Types: 4 Glasses of wine per week    Comment: occ wine   Drug use: No       OPHTHALMIC EXAM:  Base Eye Exam     Visual Acuity (Snellen - Linear)       Right Left   Dist [place] 20/20 20/20 -         Tonometry (Tonopen, 8:47 AM)       Right Left   Pressure 17 16         Pupils        Dark Light Shape React APD   Right 2 1 Round Minimal None   Left 2 1 Round Minimal None         Visual Fields (Counting fingers)       Left Right    Full Full         Extraocular Movement       Right Left    Full, Ortho Full, Ortho         Neuro/Psych     Oriented x3: Yes   Mood/Affect: Normal         Dilation     Both eyes: 1.0% Mydriacyl, 2.5% Phenylephrine @ 8:47 AM           Slit Lamp and Fundus Exam     Slit Lamp Exam       Right Left   Lids/Lashes Dermatochalasis - upper lid, mild MGD Dermatochalasis - upper lid, mild MGD   Conjunctiva/Sclera White and quiet White and quiet, early pterygium nasally   Cornea 1-2+ fine Punctate epithelial erosions, mild tear film debris 1+ fine Punctate epithelial erosions, arcus, trace EBMD, early pterygium nasally   Anterior Chamber Deep and quiet Deep and quiet   Iris Round and dilated Round and dilated   Lens PC IOL in good position PC IOL in good position   Anterior Vitreous Vitreous syneresis, Posterior vitreous detachment, Weiss ring, interval improvement in vitreous condensations Vitreous syneresis         Fundus Exam       Right Left   Disc Pink and Sharp, mild tilt mild Pallor, Sharp rim, mild tilt, temporal PPA   C/D Ratio 0.4 0.5   Macula Flat, Good foveal reflex, mild RPE mottling, trace ERM, No heme or edema Flat, Good foveal reflex, mild RPE mottling, trace ERM, No heme or edema   Vessels mild attenuation mild attenuation   Periphery Attached, small retinal tear at 0500 equator with surrounding heme - now operculated with good laser surrounding, shallow schisis IT and ST periphery, new HST at 0430 periphery -- good laser surrounding Attached; focal schisis with pigmented demarcation line at 0400           IMAGING AND PROCEDURES  Imaging and Procedures for 11/27/2021  OCT, Retina - OU - Both Eyes       Right Eye Quality was good. Central Foveal Thickness: 330. Progression has been stable.  Findings include normal foveal contour, no IRF, no SRF, myopic contour (Peripheral schisis IT periphery caught on widefield).   Left Eye Quality was good. Central  Foveal Thickness: 314. Progression has been stable. Findings include normal foveal contour, no IRF, no SRF, myopic contour (Partial PVD, IT schisis caught on widefield).   Notes *Images captured and stored on drive  Diagnosis / Impression:  NFP, no IRF/SRF OU centrally OD: Peripheral schisis IT periphery caught on widefield OS: IT schisis caught on widefield  Clinical management:  See below  Abbreviations: NFP - Normal foveal profile. CME - cystoid macular edema. PED - pigment epithelial detachment. IRF - intraretinal fluid. SRF - subretinal fluid. EZ - ellipsoid zone. ERM - epiretinal membrane. ORA - outer retinal atrophy. ORT - outer retinal tubulation. SRHM - subretinal hyper-reflective material. IRHM - intraretinal hyper-reflective material            ASSESSMENT/PLAN:    ICD-10-CM   1. Posterior vitreous detachment of right eye  H43.811 OCT, Retina - OU - Both Eyes    2. Vitreous hemorrhage of right eye ([place])  H43.11     3. 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 Pseudophakia of both eyes  Z96.1      1,2. Hemorrhagic PVD OD  - Onset 10.5.70 w/ symptomatic flashes/floaters OD -- called Dr. Talbert Forest' office 10.6.22  - Discussed findings and prognosis  - small retinal tear at 0700 equator -- see below; +blood stained vit condensations essentially resolved now  - BCVA 20/20 OD  - No new RT/RD on 360 peripheral exam, HST identified at 0430 on 01.11.23 -- see below  - Reviewed s/s of RT/RD  - Strict return precautions for any such RT/RD signs/symptoms  - F/U 3 months, DFE, OCT  3. Retinal tear, OD - small tear at 0700 equator with surrounding heme, no SRF - s/p laser retinopexy OD 10.06.22 -- good laser in place -  HST found at 0430 periphery OD  - s/p laser retinopexy OD (01.11.23) -- good laser in place - completed PF QID OD x7 days - f/u in 3 months, DFE, OCT  4. Peripheral retinoschisis OU  - OD w/ peripheral schisis in ST and IT quadrants -- no associated RT/RD  - OS w/ focal schisis at 0400 w/ pigmented demarcation line just posterior to schisis cavity -- no associated RT/RD  - monitor   5. Pseudophakia OU  - s/p CE/IOL OU (Dr. Talbert Forest, 2019)  - IOLs in perfect position, doing well  - monitor   Ophthalmic Meds Ordered this visit:  No orders of the defined types were placed in this encounter.    Return in about 3 months (around 02/25/2022) for f/u retinal tears OD, DFE, OCT.  There are no Patient Instructions on file for this visit.  Explained the diagnoses, plan, and follow up with the patient and they expressed understanding.  Patient expressed understanding of the importance of proper follow up care.   This document serves as a record of services personally performed by Gardiner Sleeper, MD, PhD. It was created on their behalf by Orvan Falconer, an ophthalmic technician. The creation of this record is the provider's dictation and/or activities during the visit.    Electronically signed by: Orvan Falconer, OA, 11/27/21  9:31 AM  This document serves as a record of services personally performed by Gardiner Sleeper, MD, PhD. It was created on their behalf by San Jetty. Owens Shark, OA an ophthalmic technician. The creation of this record is the provider's dictation  and/or activities during the visit.    Electronically signed by: San Jetty. Owens Shark, [place] 01.25.2023 9:31 AM  Gardiner Sleeper, M.D., Ph.D. Diseases & Surgery of the Retina and Vitreous [facility]  I have reviewed the above documentation for accuracy and completeness, and I agree with the above. Gardiner Sleeper, M.D., Ph.D. 11/27/21 9:31 AM  Abbreviations: M myopia (nearsighted); A astigmatism; H hyperopia  (farsighted); P presbyopia; Mrx spectacle prescription;  CTL contact lenses; OD right eye; OS left eye; OU both eyes  XT exotropia; ET esotropia; PEK punctate epithelial keratitis; PEE punctate epithelial erosions; DES dry eye syndrome; MGD meibomian gland dysfunction; ATs artificial tears; PFAT's preservative free artificial tears; [place] nuclear sclerotic cataract; PSC posterior subcapsular cataract; ERM epi-retinal membrane; PVD posterior vitreous detachment; RD retinal detachment; DM diabetes mellitus; DR diabetic retinopathy; NPDR non-proliferative diabetic retinopathy; PDR proliferative diabetic retinopathy; CSME clinically significant macular edema; DME diabetic macular edema; dbh dot blot hemorrhages; CWS cotton wool spot; POAG primary open angle glaucoma; C/D cup-to-disc ratio; HVF humphrey visual field; GVF goldmann visual field; OCT optical coherence tomography; IOP intraocular pressure; BRVO Branch retinal vein occlusion; CRVO central retinal vein occlusion; CRAO central retinal artery occlusion; BRAO branch retinal artery occlusion; RT retinal tear; SB scleral buckle; PPV pars plana vitrectomy; VH Vitreous hemorrhage; PRP panretinal laser photocoagulation; IVK intravitreal kenalog; VMT vitreomacular traction; MH Macular hole;  NVD neovascularization of the disc; NVE neovascularization elsewhere; AREDS age related eye disease study; ARMD age related macular degeneration; POAG primary open angle glaucoma; EBMD epithelial/anterior basement membrane dystrophy; ACIOL anterior chamber intraocular lens; IOL intraocular lens; PCIOL posterior chamber intraocular lens; Phaco/IOL phacoemulsification with intraocular lens placement; [place] photorefractive keratectomy; LASIK laser assisted in situ keratomileusis; HTN hypertension; DM diabetes mellitus; COPD chronic obstructive pulmonary disease

## 2021-11-27 ENCOUNTER — Other Ambulatory Visit: Payer: Self-pay

## 2021-11-27 ENCOUNTER — Encounter (INDEPENDENT_AMBULATORY_CARE_PROVIDER_SITE_OTHER): Payer: Self-pay | Admitting: Ophthalmology

## 2021-11-27 ENCOUNTER — Ambulatory Visit (INDEPENDENT_AMBULATORY_CARE_PROVIDER_SITE_OTHER): Payer: Medicare Other | Admitting: Ophthalmology

## 2021-11-27 DIAGNOSIS — H4311 Vitreous hemorrhage, right eye: Secondary | ICD-10-CM

## 2021-11-27 DIAGNOSIS — H33311 Horseshoe tear of retina without detachment, right eye: Secondary | ICD-10-CM | POA: Diagnosis not present

## 2021-11-27 DIAGNOSIS — H43811 Vitreous degeneration, right eye: Secondary | ICD-10-CM | POA: Diagnosis not present

## 2021-11-27 DIAGNOSIS — H33103 Unspecified retinoschisis, bilateral: Secondary | ICD-10-CM | POA: Diagnosis not present

## 2021-11-27 DIAGNOSIS — Z961 Presence of intraocular lens: Secondary | ICD-10-CM

## 2022-02-20 NOTE — Progress Notes (Signed)
?[facility] - Clinic Note ? ?02/26/2022 ? ?  ? ?CHIEF COMPLAINT ?Patient presents for Retina Follow Up ? ? ? ?HISTORY OF PRESENT ILLNESS: ?Michelle Price is a 76 y.o. female who presents to the clinic today for:  ? ?HPI   ? ? Retina Follow Up   ?Patient presents with  Other.  In right eye.  Duration of 3 months.  Since onset it is stable.  I, the attending physician,  performed the HPI with the patient and updated documentation appropriately. ? ?  ?  ? ? Comments   ?3 month follow up retin tears OD- Doing well, no new floaters.  ? ?  ?  ?Last edited by Rennis ChrisZamora, Dylann Gallier, MD on 02/27/2022  1:18 PM.  ?  ? ?Referring physician: ?Ander PurpuraJones, John, OD ?[phone] ?[place] [id number] ? ?HISTORICAL INFORMATION:  ? ?Selected notes from the MEDICAL RECORD NUMBER ?Dr. Vonna KotykBevis pt here for fol / floaters OD  ? ?CURRENT MEDICATIONS: ?No current outpatient medications on file. (Ophthalmic Drugs)  ? ?No current facility-administered medications for this visit. (Ophthalmic Drugs)  ? ?Current Outpatient Medications (Other)  ?Medication Sig  ? Cholecalciferol (VITAMIN D-3) 1000 units CAPS Take 1 capsule by mouth daily.  ? Coenzyme Q10 (CO Q 10) 100 MG CAPS 1x a day  ? Cyanocobalamin (B-12) 100 MCG TABS 1x a day  ? hydrocortisone 2.5 % cream hydrocortisone 2.5 % topical cream  ? NONFORMULARY OR COMPOUNDED ITEM Triamcinolone 0.1% and Silvadene Cream 3:1 compound and use BID prn 4 oz.  ? zolpidem (AMBIEN) 5 MG tablet Take 1 tablet prn only for travel (Patient taking differently: Take 5 mg by mouth. Taking 1/2 tablet QHS for sleep PRN)  ? Ascorbic Acid (VITAMIN C) 500 MG CAPS See admin instructions.  ? ?No current facility-administered medications for this visit. (Other)  ? ?REVIEW OF SYSTEMS: ?ROS   ?Positive for: Musculoskeletal, Eyes ?Negative for: Constitutional, Gastrointestinal, Neurological, Skin, Genitourinary, HENT, Endocrine, Cardiovascular, Respiratory, Psychiatric, Allergic/Imm, Heme/Lymph ?Last  edited by Joni ReiningHodges, Robin, COA on 02/26/2022  8:06 AM.  ?  ? ?ALLERGIES ?Allergies  ?Allergen Reactions  ? Codeine Nausea And Vomiting  ? Erythromycin Nausea Only  ? Nucynta [Tapentadol]   ? ?PAST MEDICAL HISTORY ?Past Medical History:  ?Diagnosis Date  ? Arthritis   ? Family history of anesthesia complication   ? mom hard to wake, vocal cord irritation  ? Medical history non-contributory   ? ?Past Surgical History:  ?Procedure Laterality Date  ? ABDOMINAL HYSTERECTOMY  02/01/1985  ? fibroids  ? BREAST CYST ASPIRATION Left 2013  ? CARPAL TUNNEL RELEASE  2001  ? right  ? CARPAL TUNNEL RELEASE Left 09/07/2013  ? Procedure: LEFT CARPAL TUNNEL RELEASE;  Surgeon: Nicki ReaperGary R Kuzma, MD;  Location: [facility];  Service: Orthopedics;  Laterality: Left;  ? CATARACT EXTRACTION    ? CHOLECYSTECTOMY  2004  ? lap choli  ? STERIOD INJECTION Left 09/07/2013  ? Procedure:  INJECTION LEFT CARPALMETACARPAL THUMB;  Surgeon: Nicki ReaperGary R Kuzma, MD;  Location: [facility];  Service: Orthopedics;  Laterality: Left;  ? WISDOM TOOTH EXTRACTION    ? ?FAMILY HISTORY ?Family History  ?Problem Relation Age of Onset  ? Diabetes Father   ? COPD Father   ? Heart failure Paternal Grandmother   ? Diabetes Paternal Grandfather   ? Diabetes Brother   ? Retinal detachment Son   ? Breast cancer Neg Hx   ? ?SOCIAL HISTORY ?Social  History  ? ?Tobacco Use  ? Smoking status: Never  ? Smokeless tobacco: Never  ?Vaping Use  ? Vaping Use: Never used  ?Substance Use Topics  ? Alcohol use: Yes  ?  Alcohol/week: 4.0 standard drinks  ?  Types: 4 Glasses of wine per week  ?  Comment: occ wine  ? Drug use: No  ?  ? ?  ?OPHTHALMIC EXAM: ? ?Base Eye Exam   ? ? Visual Acuity (Snellen - Linear)   ? ?   Right Left  ? Dist [place] 20/20 20/20  ? ?  ?  ? ? Tonometry (Tonopen, 8:10 AM)   ? ?   Right Left  ? Pressure 17 14  ? ?  ?  ? ? Pupils   ? ?   Dark Light Shape React APD  ? Right 2 1 Round Minimal None  ? Left 2 1 Round Minimal None  ? ?  ?  ? ? Visual  Fields (Counting fingers)   ? ?   Left Right  ?  Full Full  ? ?  ?  ? ? Extraocular Movement   ? ?   Right Left  ?  Full Full  ? ?  ?  ? ? Neuro/Psych   ? ? Oriented x3: Yes  ? Mood/Affect: Normal  ? ?  ?  ? ? Dilation   ? ? Both eyes: 1.0% Mydriacyl, 2.5% Phenylephrine @ 8:10 AM  ? ?  ?  ? ?  ? ?Slit Lamp and Fundus Exam   ? ? Slit Lamp Exam   ? ?   Right Left  ? Lids/Lashes Dermatochalasis - upper lid, mild MGD Dermatochalasis - upper lid, mild MGD  ? Conjunctiva/Sclera nasal pingeucula White and quiet, early pterygium nasally  ? Cornea 1-2+ fine Punctate epithelial erosions, mild tear film debris, mild EBMD, well healed cataract wound 2+ fine Punctate epithelial erosions, arcus, trace EBMD, early pterygium nasally, well healed cataract wound  ? Anterior Chamber Deep and quiet Deep and quiet  ? Iris Round and dilated Round and dilated  ? Lens PC IOL in good position PC IOL in good position, trace non-central Posterior capsular opacification  ? Anterior Vitreous Vitreous syneresis, Posterior vitreous detachment, Weiss ring, interval improvement in vitreous condensations Vitreous syneresis  ? ?  ?  ? ? Fundus Exam   ? ?   Right Left  ? Disc Pink and Sharp, mild tilt mild Pallor, Sharp rim, mild tilt, temporal PPA  ? C/D Ratio 0.4 0.5  ? Macula Flat, Good foveal reflex, mild RPE mottling, trace ERM, No heme or edema Flat, Good foveal reflex, mild RPE mottling, trace ERM, No heme or edema  ? Vessels attenuated, mild tortuosity attenuated, mild tortuosity  ? Periphery Attached, small retinal tear at 0500 equator with surrounding heme - now operculated with good laser surrounding, shallow schisis IT and ST periphery, HST at 0430 periphery -- good laser surrounding, no new RT/RD Attached; focal schisis with pigmented demarcation line at 0400, No RT/RD  ? ?  ?  ? ?  ? ?IMAGING AND PROCEDURES  ?Imaging and Procedures for 02/26/2022 ? ?OCT, Retina - OU - Both Eyes   ? ?   ?Right Eye ?Quality was good. Central Foveal  Thickness: 328. Progression has been stable. Findings include normal foveal contour, no IRF, no SRF, myopic contour (Peripheral schisis IT periphery caught on widefield).  ? ?Left Eye ?Quality was good. Central Foveal Thickness: 317. Progression has been stable. Findings include  normal foveal contour, no IRF, no SRF, myopic contour (Partial PVD, IT schisis caught on widefield).  ? ?Notes ?*Images captured and stored on drive ? ?Diagnosis / Impression:  ?NFP, no IRF/SRF OU centrally ?OD: Peripheral schisis IT periphery caught on widefield ?OS: IT schisis caught on widefield ? ?Clinical management:  ?See below ? ?Abbreviations: NFP - Normal foveal profile. CME - cystoid macular edema. PED - pigment epithelial detachment. IRF - intraretinal fluid. SRF - subretinal fluid. EZ - ellipsoid zone. ERM - epiretinal membrane. ORA - outer retinal atrophy. ORT - outer retinal tubulation. SRHM - subretinal hyper-reflective material. IRHM - intraretinal hyper-reflective material ? ? ?  ? ?  ?  ? ?  ?ASSESSMENT/PLAN: ? ?  ICD-10-CM   ?1. Posterior vitreous detachment of right eye  H43.811 OCT, Retina - OU - Both Eyes  ?  ?2. Vitreous hemorrhage of right eye ([facility])  H43.11   ?  ?3. Retinal tear of right eye  H33.311   ?  ?4. Bilateral retinoschisis  H33.103   ?  ?5. Pseudophakia of both eyes  Z96.1   ?  ? ? ?1,2. Hemorrhagic PVD OD ? - Onset 10.05.22 w/ symptomatic flashes/floaters OD -- called Dr. Vonna Kotyk' office 10.06.22 ? - Discussed findings and prognosis ? - small retinal tear at 0700 equator -- see below; +blood stained vit condensations now stably resolved ? - BCVA 20/20 OD ? - No new RT/RD on 360 peripheral exam, HST identified at 0430 on 01.11.23 -- see below ? - Reviewed s/s of RT/RD ? - Strict return precautions for any such RT/RD signs/symptoms ? - pt is cleared from a retina standpoint for release to Dr. Ander Purpura and resumption of primary eye care ? - pt can f/u here prn ? ?3. Retinal tear, OD ?- small tear at 0700  equator with surrounding heme, no SRF ?- s/p laser retinopexy OD 10.06.22 -- good laser in place ?- HST found at 0430 periphery OD ? - s/p laser retinopexy OD (01.11.23) -- good laser in place ? - no new RT/RD ?- monit

## 2022-02-26 ENCOUNTER — Encounter (INDEPENDENT_AMBULATORY_CARE_PROVIDER_SITE_OTHER): Payer: Self-pay | Admitting: Ophthalmology

## 2022-02-26 ENCOUNTER — Ambulatory Visit (INDEPENDENT_AMBULATORY_CARE_PROVIDER_SITE_OTHER): Payer: Medicare Other | Admitting: Ophthalmology

## 2022-02-26 DIAGNOSIS — H4311 Vitreous hemorrhage, right eye: Secondary | ICD-10-CM

## 2022-02-26 DIAGNOSIS — H43811 Vitreous degeneration, right eye: Secondary | ICD-10-CM | POA: Diagnosis not present

## 2022-02-26 DIAGNOSIS — H33103 Unspecified retinoschisis, bilateral: Secondary | ICD-10-CM

## 2022-02-26 DIAGNOSIS — Z961 Presence of intraocular lens: Secondary | ICD-10-CM

## 2022-02-26 DIAGNOSIS — H33311 Horseshoe tear of retina without detachment, right eye: Secondary | ICD-10-CM

## 2022-02-27 ENCOUNTER — Encounter (INDEPENDENT_AMBULATORY_CARE_PROVIDER_SITE_OTHER): Payer: Self-pay | Admitting: Ophthalmology

## 2022-07-11 ENCOUNTER — Other Ambulatory Visit: Payer: Self-pay | Admitting: Internal Medicine

## 2022-07-11 DIAGNOSIS — Z1231 Encounter for screening mammogram for malignant neoplasm of breast: Secondary | ICD-10-CM

## 2022-08-20 ENCOUNTER — Ambulatory Visit
Admission: RE | Admit: 2022-08-20 | Discharge: 2022-08-20 | Disposition: A | Discharge status: Home / Self Care | Payer: Medicare Other | Source: Ambulatory Visit | Attending: Internal Medicine | Admitting: Internal Medicine

## 2022-08-20 DIAGNOSIS — Z1231 Encounter for screening mammogram for malignant neoplasm of breast: Secondary | ICD-10-CM

## 2022-08-22 ENCOUNTER — Other Ambulatory Visit: Payer: Self-pay | Admitting: Internal Medicine

## 2022-08-22 DIAGNOSIS — R928 Other abnormal and inconclusive findings on diagnostic imaging of breast: Secondary | ICD-10-CM

## 2022-08-30 ENCOUNTER — Other Ambulatory Visit: Payer: Self-pay | Admitting: Internal Medicine

## 2022-08-30 ENCOUNTER — Ambulatory Visit
Admission: RE | Admit: 2022-08-30 | Discharge: 2022-08-30 | Disposition: A | Discharge status: Home / Self Care | Payer: Medicare Other | Source: Ambulatory Visit | Attending: Internal Medicine | Admitting: Internal Medicine

## 2022-08-30 DIAGNOSIS — R921 Mammographic calcification found on diagnostic imaging of breast: Secondary | ICD-10-CM

## 2022-08-30 DIAGNOSIS — R928 Other abnormal and inconclusive findings on diagnostic imaging of breast: Secondary | ICD-10-CM

## 2023-03-17 ENCOUNTER — Ambulatory Visit
Admission: RE | Admit: 2023-03-17 | Discharge: 2023-03-17 | Disposition: A | Discharge status: Home / Self Care | Payer: Medicare Other | Source: Ambulatory Visit | Attending: Internal Medicine | Admitting: Internal Medicine

## 2023-03-17 DIAGNOSIS — R921 Mammographic calcification found on diagnostic imaging of breast: Secondary | ICD-10-CM

## 2023-03-31 ENCOUNTER — Other Ambulatory Visit: Payer: Self-pay | Admitting: Internal Medicine

## 2023-03-31 DIAGNOSIS — R921 Mammographic calcification found on diagnostic imaging of breast: Secondary | ICD-10-CM

## 2023-09-23 ENCOUNTER — Ambulatory Visit
Admission: RE | Admit: 2023-09-23 | Discharge: 2023-09-23 | Disposition: A | Discharge status: Home / Self Care | Payer: Medicare Other | Source: Ambulatory Visit | Attending: Internal Medicine | Admitting: Internal Medicine

## 2023-09-23 DIAGNOSIS — R921 Mammographic calcification found on diagnostic imaging of breast: Secondary | ICD-10-CM

## 2024-10-18 ENCOUNTER — Other Ambulatory Visit (HOSPITAL_BASED_OUTPATIENT_CLINIC_OR_DEPARTMENT_OTHER): Payer: Self-pay

## 2024-10-18 ENCOUNTER — Inpatient Hospital Stay (HOSPITAL_BASED_OUTPATIENT_CLINIC_OR_DEPARTMENT_OTHER): Admission: RE | Admit: 2024-10-18 | Discharge: 2024-10-18

## 2024-10-18 DIAGNOSIS — Z1231 Encounter for screening mammogram for malignant neoplasm of breast: Secondary | ICD-10-CM
